# Patient Record
Sex: Male | Born: 1970 | Race: Black or African American | Hispanic: No | Smoking: Former smoker
Health system: Southern US, Community
[De-identification: ages and names within clinical notes are randomized; demographics above are authoritative.]

## PROBLEM LIST (undated history)

## (undated) DIAGNOSIS — M549 Dorsalgia, unspecified: Principal | ICD-10-CM

## (undated) DIAGNOSIS — K219 Gastro-esophageal reflux disease without esophagitis: Secondary | ICD-10-CM

## (undated) DIAGNOSIS — E119 Type 2 diabetes mellitus without complications: Secondary | ICD-10-CM

## (undated) DIAGNOSIS — M1990 Unspecified osteoarthritis, unspecified site: Secondary | ICD-10-CM

## (undated) DIAGNOSIS — IMO0002 Reserved for concepts with insufficient information to code with codable children: Secondary | ICD-10-CM

## (undated) DIAGNOSIS — G8929 Other chronic pain: Secondary | ICD-10-CM

## (undated) DIAGNOSIS — E7800 Pure hypercholesterolemia, unspecified: Secondary | ICD-10-CM

## (undated) DIAGNOSIS — I10 Essential (primary) hypertension: Secondary | ICD-10-CM

## (undated) HISTORY — PX: FOOT SURGERY: SHX648

---

## 2006-04-23 ENCOUNTER — Encounter: Admission: RE | Admit: 2006-04-23 | Discharge: 2006-04-23 | Payer: Self-pay | Admitting: Psychiatry

## 2006-12-19 ENCOUNTER — Encounter: Admission: RE | Admit: 2006-12-19 | Discharge: 2006-12-19 | Payer: Self-pay | Admitting: Neurosurgery

## 2008-03-22 ENCOUNTER — Encounter: Admission: RE | Admit: 2008-03-22 | Discharge: 2008-03-22 | Payer: Self-pay | Admitting: Orthopedic Surgery

## 2009-07-25 ENCOUNTER — Emergency Department (HOSPITAL_COMMUNITY): Admission: EM | Admit: 2009-07-25 | Discharge: 2009-07-25 | Payer: Self-pay | Admitting: Emergency Medicine

## 2009-08-11 ENCOUNTER — Emergency Department (HOSPITAL_COMMUNITY): Admission: EM | Admit: 2009-08-11 | Discharge: 2009-08-11 | Payer: Self-pay | Admitting: Emergency Medicine

## 2009-11-15 ENCOUNTER — Emergency Department (HOSPITAL_COMMUNITY): Admission: EM | Admit: 2009-11-15 | Discharge: 2009-11-15 | Payer: Self-pay | Admitting: Family Medicine

## 2009-11-24 ENCOUNTER — Ambulatory Visit: Payer: Self-pay | Admitting: Internal Medicine

## 2009-12-26 ENCOUNTER — Emergency Department (HOSPITAL_COMMUNITY): Admission: EM | Admit: 2009-12-26 | Discharge: 2009-12-26 | Payer: Self-pay | Admitting: Family Medicine

## 2009-12-26 ENCOUNTER — Ambulatory Visit (HOSPITAL_COMMUNITY): Admission: RE | Admit: 2009-12-26 | Discharge: 2009-12-26 | Payer: Self-pay | Admitting: Family Medicine

## 2010-02-01 ENCOUNTER — Emergency Department (HOSPITAL_COMMUNITY)
Admission: EM | Admit: 2010-02-01 | Discharge: 2010-02-01 | Payer: Self-pay | Source: Home / Self Care | Admitting: Family Medicine

## 2010-02-17 ENCOUNTER — Encounter (INDEPENDENT_AMBULATORY_CARE_PROVIDER_SITE_OTHER): Payer: Self-pay | Admitting: Family Medicine

## 2010-02-17 LAB — CONVERTED CEMR LAB
ALT: 18 units/L (ref 0–53)
Albumin: 4.8 g/dL (ref 3.5–5.2)
CO2: 26 meq/L (ref 19–32)
Cholesterol: 274 mg/dL — ABNORMAL HIGH (ref 0–200)
Eosinophils Absolute: 0.2 10*3/uL (ref 0.0–0.7)
Glucose, Bld: 77 mg/dL (ref 70–99)
LDL Cholesterol: 206 mg/dL — ABNORMAL HIGH (ref 0–99)
Lymphocytes Relative: 39 % (ref 12–46)
Lymphs Abs: 3.7 10*3/uL (ref 0.7–4.0)
Monocytes Relative: 7 % (ref 3–12)
Neutro Abs: 4.9 10*3/uL (ref 1.7–7.7)
Neutrophils Relative %: 51 % (ref 43–77)
Platelets: 228 10*3/uL (ref 150–400)
Potassium: 4.5 meq/L (ref 3.5–5.3)
RBC: 5.1 M/uL (ref 4.22–5.81)
Sed Rate: 1 mm/hr (ref 0–16)
Sodium: 142 meq/L (ref 135–145)
Total Bilirubin: 0.3 mg/dL (ref 0.3–1.2)
Total Protein: 7.4 g/dL (ref 6.0–8.3)
Triglycerides: 133 mg/dL (ref <150)
VLDL: 27 mg/dL (ref 0–40)
WBC: 9.6 10*3/uL (ref 4.0–10.5)

## 2010-04-09 ENCOUNTER — Encounter: Payer: Self-pay | Admitting: Orthopedic Surgery

## 2010-04-09 ENCOUNTER — Encounter: Payer: Self-pay | Admitting: Family Medicine

## 2010-05-12 ENCOUNTER — Other Ambulatory Visit (HOSPITAL_COMMUNITY): Payer: Self-pay | Admitting: Family Medicine

## 2010-05-12 DIAGNOSIS — M5136 Other intervertebral disc degeneration, lumbar region: Secondary | ICD-10-CM

## 2010-05-22 ENCOUNTER — Ambulatory Visit (HOSPITAL_COMMUNITY)
Admission: RE | Admit: 2010-05-22 | Discharge: 2010-05-22 | Disposition: A | Discharge status: Home / Self Care | Payer: Self-pay | Source: Ambulatory Visit | Attending: Family Medicine | Admitting: Family Medicine

## 2010-05-22 DIAGNOSIS — M545 Low back pain, unspecified: Secondary | ICD-10-CM | POA: Insufficient documentation

## 2010-05-22 DIAGNOSIS — R209 Unspecified disturbances of skin sensation: Secondary | ICD-10-CM | POA: Insufficient documentation

## 2010-05-22 DIAGNOSIS — M5136 Other intervertebral disc degeneration, lumbar region: Secondary | ICD-10-CM

## 2010-06-13 ENCOUNTER — Encounter (INDEPENDENT_AMBULATORY_CARE_PROVIDER_SITE_OTHER): Payer: Self-pay | Admitting: Family Medicine

## 2010-06-13 LAB — CONVERTED CEMR LAB
Cholesterol: 238 mg/dL — ABNORMAL HIGH (ref 0–200)
HDL: 40 mg/dL (ref >39)
Triglycerides: 104 mg/dL (ref <150)
VLDL: 21 mg/dL (ref 0–40)

## 2010-11-28 ENCOUNTER — Inpatient Hospital Stay (INDEPENDENT_AMBULATORY_CARE_PROVIDER_SITE_OTHER)
Admission: RE | Admit: 2010-11-28 | Discharge: 2010-11-28 | Disposition: A | Discharge status: Home / Self Care | Payer: Medicaid Other | Source: Ambulatory Visit | Attending: Family Medicine | Admitting: Family Medicine

## 2010-11-28 DIAGNOSIS — M545 Low back pain, unspecified: Secondary | ICD-10-CM

## 2010-12-25 ENCOUNTER — Emergency Department (HOSPITAL_COMMUNITY)
Admission: EM | Admit: 2010-12-25 | Discharge: 2010-12-25 | Disposition: A | Discharge status: Home / Self Care | Payer: Medicaid Other | Attending: Emergency Medicine | Admitting: Emergency Medicine

## 2010-12-25 DIAGNOSIS — M549 Dorsalgia, unspecified: Secondary | ICD-10-CM | POA: Insufficient documentation

## 2010-12-25 DIAGNOSIS — G8929 Other chronic pain: Secondary | ICD-10-CM | POA: Insufficient documentation

## 2010-12-25 DIAGNOSIS — R269 Unspecified abnormalities of gait and mobility: Secondary | ICD-10-CM | POA: Insufficient documentation

## 2011-01-11 ENCOUNTER — Inpatient Hospital Stay (INDEPENDENT_AMBULATORY_CARE_PROVIDER_SITE_OTHER)
Admission: RE | Admit: 2011-01-11 | Discharge: 2011-01-11 | Disposition: A | Discharge status: Home / Self Care | Payer: Medicaid Other | Source: Ambulatory Visit | Attending: Family Medicine | Admitting: Family Medicine

## 2011-01-11 DIAGNOSIS — M549 Dorsalgia, unspecified: Secondary | ICD-10-CM

## 2011-02-10 ENCOUNTER — Emergency Department (INDEPENDENT_AMBULATORY_CARE_PROVIDER_SITE_OTHER)
Admission: EM | Admit: 2011-02-10 | Discharge: 2011-02-10 | Disposition: A | Discharge status: Home / Self Care | Payer: Medicaid Other | Source: Home / Self Care | Attending: Family Medicine | Admitting: Family Medicine

## 2011-02-10 DIAGNOSIS — M549 Dorsalgia, unspecified: Secondary | ICD-10-CM

## 2011-02-10 DIAGNOSIS — G8929 Other chronic pain: Secondary | ICD-10-CM

## 2011-02-10 HISTORY — DX: Gastro-esophageal reflux disease without esophagitis: K21.9

## 2011-02-10 HISTORY — DX: Pure hypercholesterolemia, unspecified: E78.00

## 2011-02-10 HISTORY — DX: Reserved for concepts with insufficient information to code with codable children: IMO0002

## 2011-02-10 MED ORDER — GABAPENTIN 400 MG PO CAPS: 400.0000 mg | ORAL_CAPSULE | Freq: Three times a day (TID) | ORAL | Status: DC

## 2011-02-10 MED ORDER — TRAMADOL HCL 50 MG PO TABS: 50.0000 mg | ORAL_TABLET | Freq: Three times a day (TID) | ORAL | Status: AC | PRN

## 2011-02-10 NOTE — ED Provider Notes (Signed)
History     CSN: [id number] Arrival date & time: 02/10/2011 11:46 AM   First MD Initiated Contact with Patient 02/10/11 1049      Chief Complaint  Patient presents with  . Medication Refill    Pt needs refill on tramdol and nuerontin, ran out and office is closed.    (Consider location/radiation/quality/duration/timing/severity/associated sxs/prior treatment) HPI Comments: Pt requests refill of tramadol and gabapentin.  States ran out of tramadol this morning and gabapentin 11/22.  States his doctor's office was closed 11/22 until 11/26 for the holiday.  Denies new sx, states back pain is chronic due to arthritis and herniated discs. States is seeing neurosurgeon 12/11 for eval.   The history is provided by the patient.    Past Medical History  Diagnosis Date  . Hypercholesteremia   . Acid reflux   . Herniated disc     History reviewed. No pertinent past surgical history.  History reviewed. No pertinent family history.  History  Substance Use Topics  . Smoking status: Current Everyday Smoker -- 0.5 packs/day    Types: Cigarettes  . Smokeless tobacco: Not on file  . Alcohol Use: Yes      Review of Systems  Constitutional: Negative for fever and chills.  Genitourinary: Negative for difficulty urinating.       Denies urinary or fecal incontinence  Musculoskeletal: Positive for back pain.  Neurological: Negative for weakness and numbness.    Allergies  Other  Home Medications   Current Outpatient Rx  Name Route Sig Dispense Refill  . GABAPENTIN 400 MG PO CAPS Oral Take 400 mg by mouth 3 (three) times daily.      Marland Kitchen METHOCARBAMOL 500 MG PO TABS Oral Take 500 mg by mouth 4 (four) times daily.      . TRAMADOL HCL 50 MG PO TABS Oral Take 50 mg by mouth every 8 (eight) hours. Maximum dose= 8 tablets per day     . UNKNOWN TO PATIENT  Unknown acid reflux and chol med     . GABAPENTIN 400 MG PO CAPS Oral Take 1 capsule (400 mg total) by mouth 3 (three) times daily. 12  capsule 0  . TRAMADOL HCL 50 MG PO TABS Oral Take 1 tablet (50 mg total) by mouth every 8 (eight) hours as needed for pain. Maximum dose= 8 tablets per day 12 tablet 0    BP 123/78  Pulse 84  Temp(Src) 99 F (37.2 C) (Oral)  Resp 16  SpO2 98%  Physical Exam  Constitutional: He is oriented to person, place, and time. He appears well-developed and well-nourished.       Uncomfortable appearing  Pulmonary/Chest: Effort normal.  Musculoskeletal:       Lumbar spine is site of pain, nontender to palp  Neurological: He is alert and oriented to person, place, and time.       Gait normal    ED Course  Procedures (including critical care time)  Labs Reviewed - No data to display No results found.   1. Chronic back pain       MDM  Review of pt's previous visits to health system show 2 visits in October for same med refill request.  It appears pt is not following up with pcp.         Cathlyn Parsons, NP 02/10/11 1350

## 2011-02-10 NOTE — ED Provider Notes (Signed)
Medical screening examination/treatment/procedure(s) were performed by non-physician practitioner and as supervising physician I was immediately available for consultation/collaboration.  Corrie Mckusick, MD 02/10/11 1956

## 2011-11-13 DIAGNOSIS — M545 Low back pain, unspecified: Secondary | ICD-10-CM | POA: Insufficient documentation

## 2011-11-13 DIAGNOSIS — M7918 Myalgia, other site: Secondary | ICD-10-CM | POA: Insufficient documentation

## 2011-11-15 ENCOUNTER — Emergency Department (HOSPITAL_COMMUNITY)
Admission: EM | Admit: 2011-11-15 | Discharge: 2011-11-15 | Disposition: A | Discharge status: Home / Self Care | Payer: Medicaid Other | Source: Home / Self Care | Attending: Emergency Medicine | Admitting: Emergency Medicine

## 2011-11-15 ENCOUNTER — Encounter (HOSPITAL_COMMUNITY): Payer: Self-pay | Admitting: *Deleted

## 2011-11-15 DIAGNOSIS — M549 Dorsalgia, unspecified: Secondary | ICD-10-CM

## 2011-11-15 DIAGNOSIS — G8929 Other chronic pain: Secondary | ICD-10-CM

## 2011-11-15 HISTORY — DX: Other chronic pain: G89.29

## 2011-11-15 HISTORY — DX: Dorsalgia, unspecified: M54.9

## 2011-11-15 MED ORDER — KETOROLAC TROMETHAMINE 60 MG/2ML IM SOLN
INTRAMUSCULAR | Status: AC
  Filled 2011-11-15: qty 2

## 2011-11-15 MED ORDER — KETOROLAC TROMETHAMINE 30 MG/ML IJ SOLN
60.0000 mg | Freq: Once | INTRAMUSCULAR | Status: AC
  Administered 2011-11-15: 60 mg via INTRAMUSCULAR

## 2011-11-15 NOTE — ED Provider Notes (Signed)
History     CSN: [id number]  Arrival date & time 11/15/11  1633   First MD Initiated Contact with Patient 11/15/11 1657      Chief Complaint  Patient presents with  . Back Pain    (Consider location/radiation/quality/duration/timing/severity/associated sxs/prior treatment) HPI Comments: Patient with a long history of chronic back pain presents requesting a refill of his Norco. He normally takes etodolac, Norco, Neurontin, Robaxin, tramadol for his chronic back pain. This is managed by his primary care physician. He states that this generally worked well for him the past, but recently saw a specialist who discontinued his Robaxin and started him to Zanaflex (Dr. Deno Etienne at [facility]). Patient states that his back is "stiffer" with increased muscle spasms since starting the Zanaflex. He increased his normal Norco dose, and has run out. Otherwise, patient reports no change in his usual baseline pain  He was seen in the [facility] requesting refill of tramadol and Neurontin November 2012. He is given a shot of Toradol, which he says worked well. No visits to the ED or urgent care since.  Patient is a 41 y.o. male presenting with back pain. The history is provided by the patient. No language interpreter was used.  Back Pain  This is a chronic problem. The current episode started more than 1 week ago. The problem occurs constantly. The problem has not changed since onset.The pain is present in the lumbar spine. The quality of the pain is described as aching. The pain does not radiate. The symptoms are aggravated by bending, twisting and certain positions. The pain is worse during the day. Pertinent negatives include no fever, no numbness, no abdominal pain, no bowel incontinence, no bladder incontinence, no leg pain, no paresthesias and no weakness. He has tried NSAIDs, muscle relaxants, analgesics and bed rest for the symptoms. The treatment provided mild relief.    Past Medical History  Diagnosis Date    . Hypercholesteremia   . Acid reflux   . Herniated disc   . Chronic back pain     History reviewed. No pertinent past surgical history.  History reviewed. No pertinent family history.  History  Substance Use Topics  . Smoking status: Former Smoker -- 0.5 packs/day    Types: Cigarettes  . Smokeless tobacco: Not on file  . Alcohol Use: Yes      Review of Systems  Constitutional: Negative for fever.  Gastrointestinal: Negative for abdominal pain and bowel incontinence.  Genitourinary: Negative for bladder incontinence.  Musculoskeletal: Positive for back pain.  Neurological: Negative for weakness, numbness and paresthesias.    Allergies  Other  Home Medications   Current Outpatient Rx  Name Route Sig Dispense Refill  . ETODOLAC 200 MG PO CAPS Oral Take 200 mg by mouth every 8 (eight) hours.    Marland Kitchen HYDROCODONE-ACETAMINOPHEN 10-325 MG PO TABS Oral Take 1 tablet by mouth every 6 (six) hours as needed.    Marland Kitchen GABAPENTIN 400 MG PO CAPS Oral Take 400 mg by mouth 3 (three) times daily.      Marland Kitchen GABAPENTIN 400 MG PO CAPS Oral Take 1 capsule (400 mg total) by mouth 3 (three) times daily. 12 capsule 0  . METHOCARBAMOL 500 MG PO TABS Oral Take 500 mg by mouth 4 (four) times daily.      . TRAMADOL HCL 50 MG PO TABS Oral Take 50 mg by mouth every 8 (eight) hours. Maximum dose= 8 tablets per day     . UNKNOWN TO PATIENT  Unknown  acid reflux and chol med       BP 113/75  Pulse 84  Temp 98.7 F (37.1 C) (Oral)  Resp 19  SpO2 97%  Physical Exam  Nursing note and vitals reviewed. Constitutional: He is oriented to person, place, and time. He appears well-developed and well-nourished. No distress.  HENT:  Head: Normocephalic and atraumatic.  Eyes: Conjunctivae and EOM are normal.  Neck: Normal range of motion.  Cardiovascular: Normal rate.   Pulmonary/Chest: Effort normal. No respiratory distress.  Abdominal: He exhibits no distension.  Musculoskeletal: Normal range of motion.        Lumbar back: He exhibits tenderness and spasm. He exhibits no bony tenderness.        tenderness, spasm left paralumbar area. Bilateral lower extremities nontender. baseline ROM with intact PT pulses.   Sensation baseline light touch intact bilaterally for Pt, DTR's symmetric and intact bilaterally KJ , Motor symmetric bilateral 5/5 hip flexion, quadriceps, hamstrings, EHL, foot dorsiflexion, foot plantarflexion, gait somewhat antalgic but without apparent new ataxia.   Neurological: He is alert and oriented to person, place, and time. Coordination normal.  Skin: Skin is warm and dry.  Psychiatric: He has a normal mood and affect. His behavior is normal. Judgment and thought content normal.    ED Course  Procedures (including critical care time)  Labs Reviewed - No data to display No results found.   1. Chronic back pain     MDM  Previous records reviewed. As noted in history of present illness. [place] narcotic database reviewed.rx'd norco 10/325 #60 on 7/12, and #60 on 8/8 from Dr. Lerry Liner.   No evidence of any acute changes in his chronic pain today. No red flags. Suspect that's he was taken off a regimen that worked well for him in the past. Discussed with patient that it is his responsibility to maintain ongoing narcotic prescriptions with his primary care physician, but that I would be happy to give him a shot of Toradol here today in the clinic. Patient states that he'll be able to see  his PMD tomorrow for his narcotic prescriptions. Advised him to stop the Zanaflex, and restart the Robaxin. He says he has plenty of Robaxin. He will continue his Neurontin and tramadol., Which she says he has plenty of. Discussed  symptoms that should prompt  return to the department. Patient agrees with plan.  Luiz Blare, MD 11/15/11 2216

## 2011-11-15 NOTE — ED Notes (Signed)
Pt  Reports  He  Has  Chronic  Low  Back  Pain   He  Reports  He  Recently  Saw  Specialist    At  [facility]  hosp  Who  Changed  His  Muscle  Relaxant  He  Reports  Yesterday  He  Developed  Back  Spasms  He  States  He  Needs  meds   Until  Tuesday  When he  Can  See  His  Family  doctor

## 2011-11-16 ENCOUNTER — Telehealth (HOSPITAL_COMMUNITY): Payer: Self-pay | Admitting: *Deleted

## 2011-11-16 NOTE — ED Notes (Signed)
Dr. Mayford Knife office called and asked what medications did we give yesterday.  I told her we gave a shot of Toradol 60 mg. But no prescriptions.  Dr. Chaney Malling had reviewed his record and told him to stop the Zanaflex and take Robaxin, Neurontin and Tramadol which he already had.  They wanted to make sure we did not give narcotics and I said no we did not. Vassie Moselle 11/16/2011

## 2013-01-06 ENCOUNTER — Ambulatory Visit (INDEPENDENT_AMBULATORY_CARE_PROVIDER_SITE_OTHER): Payer: Medicaid Other | Admitting: Cardiovascular Disease

## 2013-01-06 ENCOUNTER — Encounter: Payer: Self-pay | Admitting: Cardiovascular Disease

## 2013-01-06 VITALS — BP 100/70 | HR 70 | Ht 71.0 in | Wt 168.3 lb

## 2013-01-06 DIAGNOSIS — E785 Hyperlipidemia, unspecified: Secondary | ICD-10-CM | POA: Insufficient documentation

## 2013-01-06 DIAGNOSIS — R7402 Elevation of levels of lactic acid dehydrogenase (LDH): Secondary | ICD-10-CM

## 2013-01-06 DIAGNOSIS — R7401 Elevation of levels of liver transaminase levels: Secondary | ICD-10-CM

## 2013-01-06 DIAGNOSIS — R7989 Other specified abnormal findings of blood chemistry: Secondary | ICD-10-CM | POA: Insufficient documentation

## 2013-01-06 DIAGNOSIS — R945 Abnormal results of liver function studies: Secondary | ICD-10-CM | POA: Insufficient documentation

## 2013-01-06 DIAGNOSIS — R002 Palpitations: Secondary | ICD-10-CM | POA: Insufficient documentation

## 2013-01-06 NOTE — Patient Instructions (Addendum)
Your physician has requested that you have an echocardiogram. Echocardiography is a painless test that uses sound waves to create images of your heart. It provides your doctor with information about the size and shape of your heart and how well your heart's chambers and valves are working. This procedure takes approximately one hour. There are no restrictions for this procedure.   Your physician has recommended that you wear an event monitor. Event monitors are medical devices that record the heart's electrical activity. Doctors most often [place] these monitors to diagnose arrhythmias. Arrhythmias are problems with the speed or rhythm of the heartbeat. The monitor is a small, portable device. You can wear one while you do your normal daily activities. This is usually used to diagnose what is causing palpitations/syncope (passing out).  Your physician recommends that you schedule a follow-up appointment in: 4-6 weeks.  Your physician recommends that you return for lab work to check your liver functions.

## 2013-01-06 NOTE — Progress Notes (Signed)
Patient ID: Wayne Roberts, male   DOB: 05-05-1970, 42 y.o.   MRN: [id number]     PATIENT PROFILE: [name] is a 42 year old African American male who is referred through the courtesy of Dr. Lerry Liner for evaluation of irregular heartbeat and question of possible heart block.   HPI: [name] is a 42 year old gentleman who admits to a long-standing history of episodic palpitations. Recently, he has started to notice the sensation of longer pauses. These typically are short lived but they seen to have prolonged compared to recently. He's uncertain if he feels a premature beat prior to the pause. Apparently he saw Dr. Raechel Chute and is now referred for cardiology evaluation  The patient is on disability for back degenerative disc disease. He does have a history of GERD. He apparently has a history of hyperlipidemia. He takes tramadol as well as hydrocodone and Robaxin for his back discomfort. He has been on lovastatin for his hyperlipidemia. He denies any flank syncope or presyncope. He denies any chest tightness. He denies significant sleep disturbance. He presents for evaluation.  Past Medical History  Diagnosis Date  . Hypercholesteremia   . Acid reflux   . Herniated disc   . Chronic back pain     History reviewed. No pertinent past surgical history.  Allergies  Allergen Reactions  . Other     Pt has nausea with diflu? Arthritis med?    Current Outpatient Prescriptions  Medication Sig Dispense Refill  . HYDROcodone-acetaminophen (NORCO) 10-325 MG per tablet Take 1 tablet by mouth every 6 (six) hours as needed.      . lovastatin (MEVACOR) 40 MG tablet Take 40 mg by mouth at bedtime.      . methocarbamol (ROBAXIN) 500 MG tablet Take 500 mg by mouth 4 (four) times daily.        Marland Kitchen omeprazole (PRILOSEC) 20 MG capsule Take 20 mg by mouth daily.      . traMADol (ULTRAM) 50 MG tablet Take 50 mg by mouth every 8 (eight) hours. Maximum dose= 8 tablets per day        No current  facility-administered medications for this visit.    Social history is notable in that he is separated. He has one child a son age 82 in kindergarten. Typically he takes his child to school and he goes back home and rests and sleeps and then ultimately picks the his son up from school later in the afternoon. There is a remote tobacco history but he quit in August 2014. He states he only drinks rare alcohol. He does not routinely exercise and when he does it steadily for 15 minutes at a time  Family History  Problem Relation Age of Onset  . Cancer - Other Father     liver  . Cancer - Other Maternal Grandmother   . Cancer - Other Maternal Grandfather     ROS is negative for fever chills or night sweats. He denies skin rash. He does have multiple tattoos. He denies visual symptoms. He denies presyncope or syncope. He does note palpitations. He denies chest pressure. He denies wheezing. He denies shortness of breath. He denies sputum production. He denies cough. He denies abdominal pain. Does have a history of GERD. He denies change in bowel or bladder habits. He denies blood in his urine or stool. He denies any knowledge of liver disease. He does have chronic low back pain. He states in the past he was told of having a bulging disc  and this may have improved. He denies claudication symptoms. He denies myalgias. He denies peripheral neuropathy. He is unaware of any diabetes. Other system review is negative.  PE BP 100/70  Pulse 70  Ht 5\' 11"  (1.803 m)  Wt 168 lb 4.8 oz (76.34 kg)  BMI 23.48 kg/m2 Repeat blood pressure was 112/70 General: Alert, oriented, no distress.  Skin: normal turgor, no rashes; multiple tattoos HEENT: Normocephalic, atraumatic. Pupils round and reactive; sclera anicteric; Fundi no hemorrhages or exudates Nose without nasal septal hypertrophy Mouth/Parynx benign; Mallinpatti scale 3 Neck: No JVD, no carotid briuts Lungs: clear to ausculatation and percussion; no wheezing  or rales No chest wall tenderness or bony tenderness. Heart: RRR, s1 s2 normal no S3 or S4. 1/6 systolic murmur. Abdomen: soft, nontender; no hepatosplenomehaly, BS+; abdominal aorta nontender and not dilated by palpation. Specifically, no right upper quadrant tenderness. Pulses 2+ Extremities: no clubbinbg cyanosis or edema, Homan's sign negative  Neurologic: grossly nonfocal    ECG: Normal sinus rhythm at 70 beats per minute. QTc interval 393 ms  LABS:  BMET    Component Value Date/Time   NA 142 02/17/2010 2013   K 4.5 02/17/2010 2013   CL 105 02/17/2010 2013   CO2 26 02/17/2010 2013   GLUCOSE 77 02/17/2010 2013   BUN 12 02/17/2010 2013   CREATININE 1.02 02/17/2010 2013   CALCIUM 9.8 02/17/2010 2013     Hepatic Function Panel     Component Value Date/Time   PROT 7.4 02/17/2010 2013   ALBUMIN 4.8 02/17/2010 2013   AST 19 02/17/2010 2013   ALT 50 06/13/2010 2033   ALKPHOS 91 02/17/2010 2013   BILITOT 0.3 02/17/2010 2013     CBC    Component Value Date/Time   WBC 9.6 02/17/2010 2013   RBC 5.10 02/17/2010 2013   HGB 15.6 02/17/2010 2013   HCT 45.3 02/17/2010 2013   PLT 228 02/17/2010 2013   MCV 88.8 02/17/2010 2013   MCHC 34.4 02/17/2010 2013   RDW 14.1 02/17/2010 2013   LYMPHSABS 3.7 02/17/2010 2013   MONOABS 0.7 02/17/2010 2013   EOSABS 0.2 02/17/2010 2013   BASOSABS 0.1 02/17/2010 2013     BNP No results found for this basename: probnp    Lipid Panel     Component Value Date/Time   CHOL 238* 06/13/2010 2033   TRIG 104 06/13/2010 2033   HDL 40 06/13/2010 2033   CHOLHDL 6.0 Ratio 06/13/2010 2033   VLDL 21 06/13/2010 2033   LDLCALC 177* 06/13/2010 2033     RADIOLOGY: No results found.   ASSESSMENT AND PLAN: My impression is that [name] is a 42 year old African American gentleman who has been experiencing episodic sensation of pauses. I suspect he may be having PVCs with compensatory pause versus APCs. However, I am recommending that he wear a 2 week event monitor for  further evaluation of his cardiac arrhythmia to make certain he is not having any heart block. I am scheduling him for a 2-D echo Doppler study. Immediately after the patient left the office, I did receive his laboratory which was done by Dr. Mayford Knife. This was notable for marked elevation of his liver function studies with an SGOT of 139 and SGPT of 113. In addition, his serum cholesterol was 265, triglycerides 207, HDL cholesterol 45, increased VLDL cholesterol at 41, and significant increase in LDL cholesterol 179. We have contacted him and have recommended he discontinue his lovastatin cause of his elevated LFTs. We will obtain followup  BMET, LFTs, and magnesium lab work in several weeks. Am also scheduling him for a 2-D echo Doppler study to evaluate LV systolic, diastolic and valvular function. If his LFTs remain elevated, I would recommend abdominal ultrasound. I will see him back in approximately 3-4 weeks followup evaluation or sooner problems arise.   Lennette Bihari, MD, [facility] 01/06/2013 6:48 PM

## 2013-01-07 ENCOUNTER — Encounter: Payer: Self-pay | Admitting: Cardiovascular Disease

## 2013-01-20 ENCOUNTER — Other Ambulatory Visit (HOSPITAL_COMMUNITY): Payer: Self-pay | Admitting: Cardiovascular Disease

## 2013-01-20 ENCOUNTER — Ambulatory Visit (HOSPITAL_COMMUNITY)
Admission: RE | Admit: 2013-01-20 | Discharge: 2013-01-20 | Disposition: A | Discharge status: Home / Self Care | Payer: Medicaid Other | Source: Ambulatory Visit | Attending: Internal Medicine | Admitting: Internal Medicine

## 2013-01-20 DIAGNOSIS — R002 Palpitations: Secondary | ICD-10-CM

## 2013-01-20 LAB — HEPATIC FUNCTION PANEL
ALT: 18 U/L (ref 0–53)
AST: 20 U/L (ref 0–37)
Bilirubin, Direct: <0.1 mg/dL (ref 0.0–0.3)
Total Protein: 6.9 g/dL (ref 6.0–8.3)

## 2013-01-20 LAB — COMPREHENSIVE METABOLIC PANEL
AST: 20 U/L (ref 0–37)
Alkaline Phosphatase: 81 U/L (ref 39–117)
BUN: 13 mg/dL (ref 6–23)
Creat: 1.1 mg/dL (ref 0.50–1.35)
Glucose, Bld: 134 mg/dL — ABNORMAL HIGH (ref 70–99)
Total Bilirubin: 0.3 mg/dL (ref 0.3–1.2)

## 2013-01-20 NOTE — Progress Notes (Signed)
2D Echo Performed 01/20/2013    Pietro Bonura, RCS  

## 2013-01-26 ENCOUNTER — Encounter: Payer: Self-pay | Admitting: *Deleted

## 2013-02-10 ENCOUNTER — Encounter: Payer: Self-pay | Admitting: Cardiovascular Disease

## 2013-02-10 ENCOUNTER — Ambulatory Visit (INDEPENDENT_AMBULATORY_CARE_PROVIDER_SITE_OTHER): Payer: Medicaid Other | Admitting: Cardiovascular Disease

## 2013-02-10 VITALS — BP 90/60 | HR 76 | Ht 71.0 in | Wt 168.0 lb

## 2013-02-10 DIAGNOSIS — R945 Abnormal results of liver function studies: Secondary | ICD-10-CM

## 2013-02-10 DIAGNOSIS — R7989 Other specified abnormal findings of blood chemistry: Secondary | ICD-10-CM

## 2013-02-10 DIAGNOSIS — R002 Palpitations: Secondary | ICD-10-CM

## 2013-02-10 DIAGNOSIS — E785 Hyperlipidemia, unspecified: Secondary | ICD-10-CM

## 2013-02-10 NOTE — Progress Notes (Signed)
Patient ID: Wayne Roberts, male   DOB: December 26, 1970, 42 y.o.   MRN: [id number]     HPI:  [name] is a 42 year old African American male who presents for followup evaluation. I had seen him one month ago through the courtesy of Dr. Lerry Roberts for evaluation of irregular heartbeat and question of possible heart block.  [name] is a 42 year old gentleman who admits to a long-standing history of episodic palpitations. Recently, he had started to notice the sensation of for palpitations with occasional pauses.  He's uncertain if he feels a premature beat prior to the pause.   The patient is on disability for back degenerative disc disease. He does have a history of GERD. He apparently has a history of hyperlipidemia. He takes tramadol as well as hydrocodone and Robaxin for his back discomfort. He has been on lovastatin for his hyperlipidemia. He denies any flank syncope or presyncope. He denies any chest tightness. He denies significant sleep disturbance. He presents for evaluation.  When I saw him, I did obtain the laboratory results from Dr. Mayford Roberts which demonstrated significant elevation of liver function studies with an SGOT of 139 SGPT of 113. His total cholesterol was 265 triglycerides 207 HDL 45 and LDL 179. With his LFT elevation I recommended he discontinue his lovastatin. He did obtain followup laboratory off lovastatin several weeks later and he had complete normalization of his liver function studies with AST of 20 and ALT of 16.  [name] did wear an event monitor for 2 weeks. No arrhythmia was detected with the exception of mild sinus tach episodes of sinus tachycardia. There was no ventricular ectopy or significant pauses. A 2-D echo Doppler study was done to confirm normal systolic function with an ejection fraction of 60-65%. Normal diastolic parameters. He did not have any significant valvular pathology.  [name] states that he has an overtly started to begin to take the  lovastatin after he completed his Holter monitor.  Past Medical History  Diagnosis Date  . Hypercholesteremia   . Acid reflux   . Herniated disc   . Chronic back pain     History reviewed. No pertinent past surgical history.  Allergies  Allergen Reactions  . Other     Pt has nausea with diflu? Arthritis med?    Current Outpatient Prescriptions  Medication Sig Dispense Refill  . HYDROcodone-acetaminophen (NORCO) 10-325 MG per tablet Take 1 tablet by mouth every 6 (six) hours as needed.      . lovastatin (MEVACOR) 40 MG tablet Take 40 mg by mouth at bedtime.      . methocarbamol (ROBAXIN) 500 MG tablet Take 500 mg by mouth 4 (four) times daily.        Marland Kitchen omeprazole (PRILOSEC) 20 MG capsule Take 20 mg by mouth daily.      . traMADol (ULTRAM) 50 MG tablet Take 50 mg by mouth every 8 (eight) hours. Maximum dose= 8 tablets per day        No current facility-administered medications for this visit.    Social history is notable in that he is separated. He has one child a son age 58 in kindergarten. Typically he takes his child to school and he goes back home and rests and sleeps and then ultimately picks the his son up from school later in the afternoon. There is a remote tobacco history but he quit in August 2014. He states he only drinks rare alcohol. He does not routinely exercise and when he does  it steadily for 15 minutes at a time  Family History  Problem Relation Age of Onset  . Cancer - Other Father     liver  . Cancer - Other Maternal Grandmother   . Cancer - Other Maternal Grandfather     ROS is negative for fever chills or night sweats. He denies skin rash. He does have multiple tattoos. He denies visual symptoms. He denies presyncope or syncope. His palpitations have improved. He denies chest pressure. He denies wheezing. He denies shortness of breath. He denies sputum production. He denies cough. He denies abdominal pain. There is a history of GERD. He denies change in bowel  or bladder habits. He denies blood in his urine or stool. He denies any knowledge of liver disease. He does have chronic low back pain. He states in the past he was told of having a bulging disc and this may have improved. He denies claudication symptoms. He denies myalgias. He denies peripheral neuropathy. He is unaware of any diabetes. Other comprehensive 12 point system review is negative.  PE BP 90/60  Pulse 76  Ht 5\' 11"  (1.803 m)  Wt 168 lb (76.204 kg)  BMI 23.44 kg/m2 Repeat blood pressure by me was 110/70 Repeat blood pressure was 112/70 General: Alert, oriented, no distress.  Skin: normal turgor, no rashes; multiple tattoos HEENT: Normocephalic, atraumatic. Pupils round and reactive; sclera anicteric; Fundi no hemorrhages or exudates Nose without nasal septal hypertrophy Mouth/Parynx benign; Mallinpatti scale 3 Neck: No JVD, no carotid briuts Lungs: clear to ausculatation and percussion; no wheezing or rales No chest wall tenderness or bony tenderness. Heart: RRR, s1 s2 normal no S3 or S4. 1/6 systolic murmur. Abdomen: soft, nontender; no hepatosplenomehaly, BS+; abdominal aorta nontender and not dilated by palpation. Specifically, no right upper quadrant tenderness. Pulses 2+ Extremities: no clubbinbg cyanosis or edema, Homan's sign negative  Neurologic: grossly nonfocal    ECG: Normal sinus rhythm at 76 beats per minute. QTc interval 405 ms  LABS:  BMET    Component Value Date/Time   NA 139 01/20/2013 0813   K 3.9 01/20/2013 0813   CL 106 01/20/2013 0813   CO2 23 01/20/2013 0813   GLUCOSE 134* 01/20/2013 0813   BUN 13 01/20/2013 0813   CREATININE 1.10 01/20/2013 0813   CREATININE 1.02 02/17/2010 2013   CALCIUM 9.3 01/20/2013 0813     Hepatic Function Panel     Component Value Date/Time   PROT 6.9 01/20/2013 0816   ALBUMIN 4.5 01/20/2013 0816   AST 20 01/20/2013 0816   ALT 18 01/20/2013 0816   ALKPHOS 77 01/20/2013 0816   BILITOT 0.3 01/20/2013 0816   BILIDIR <0.1  01/20/2013 0816   IBILI NOT CALC 01/20/2013 0816     CBC    Component Value Date/Time   WBC 9.6 02/17/2010 2013   RBC 5.10 02/17/2010 2013   HGB 15.6 02/17/2010 2013   HCT 45.3 02/17/2010 2013   PLT 228 02/17/2010 2013   MCV 88.8 02/17/2010 2013   MCHC 34.4 02/17/2010 2013   RDW 14.1 02/17/2010 2013   LYMPHSABS 3.7 02/17/2010 2013   MONOABS 0.7 02/17/2010 2013   EOSABS 0.2 02/17/2010 2013   BASOSABS 0.1 02/17/2010 2013     BNP No results found for this basename: probnp    Lipid Panel     Component Value Date/Time   CHOL 238* 06/13/2010 2033   TRIG 104 06/13/2010 2033   HDL 40 06/13/2010 2033   CHOLHDL 6.0 Ratio 06/13/2010 2033  VLDL 21 06/13/2010 2033   LDLCALC 177* 06/13/2010 2033     RADIOLOGY: No results found.   ASSESSMENT AND PLAN: My impression is that [name] is a 42 year old African American gentleman who had been experiencing episodic sensation of pauses. His recent two-week monitor did not detect any arrhythmia. Seemingly, his palpitations have improved. I suspect some of these episodes may have been PACs or perhaps PVCs. Of note, his LFTs entirely normalized with discontinuance of lovastatin. I have recommended that he stay off this drug indefinitely. He tells me he will follow up with Dr. Mayford Roberts in 2 weeks. We discussed dietary adjustment and attempt to reduce his cholesterol. If his LFTs remain norma off statin therapy it may be worthwhile to consider a trial of Zetia for his hyperlipidemia.  Lennette Bihari, MD 02/10/2013 12:56 PM

## 2013-02-10 NOTE — Patient Instructions (Signed)
Your physician recommends that you schedule a follow-up appointment as needed.  Your physician has recommended you make the following change in your medication: stay off of the cholesterol medication and follow up with your PCP. Dr. Mayford Knife for further treatment for your cholesterol.

## 2015-01-09 ENCOUNTER — Emergency Department (INDEPENDENT_AMBULATORY_CARE_PROVIDER_SITE_OTHER): Payer: Medicaid Other

## 2015-01-09 ENCOUNTER — Emergency Department (HOSPITAL_COMMUNITY)
Admission: EM | Admit: 2015-01-09 | Discharge: 2015-01-09 | Disposition: A | Discharge status: Home / Self Care | Payer: Medicaid Other | Source: Home / Self Care | Attending: Emergency Medicine | Admitting: Emergency Medicine

## 2015-01-09 ENCOUNTER — Emergency Department (HOSPITAL_COMMUNITY): Payer: Medicaid Other

## 2015-01-09 ENCOUNTER — Encounter (HOSPITAL_COMMUNITY): Payer: Self-pay | Admitting: Emergency Medicine

## 2015-01-09 DIAGNOSIS — R05 Cough: Secondary | ICD-10-CM

## 2015-01-09 DIAGNOSIS — R058 Other specified cough: Secondary | ICD-10-CM

## 2015-01-09 MED ORDER — PREDNISONE 50 MG PO TABS: ORAL_TABLET | ORAL | Status: DC

## 2015-01-09 NOTE — ED Provider Notes (Signed)
CSN: [id number]     Arrival date & time 01/09/15  1312 History   First MD Initiated Contact with Patient 01/09/15 1353     Chief Complaint  Patient presents with  . Cough   (Consider location/radiation/quality/duration/timing/severity/associated sxs/prior Treatment) HPI  He is a 44 year old man here for evaluation of cough. He states his symptoms started 10-14 days ago with cough, nasal congestion, rhinorrhea, sore throat. He states all of the symptoms have resolved except for cough. He continues to have coughing spells so bad he has posttussive emesis. He also describes fatigue. He reports intermittent shortness of breath. His wife told him she heard wheezing, but he denies any wheezing. No fevers or chills. He has tried multiple over-the-counter medications without improvement. He did take one of his hydrocodone pills for his back today, and states it does seem to have helped with the cough.  No history of asthma or allergies.  Past Medical History  Diagnosis Date  . Hypercholesteremia   . Acid reflux   . Herniated disc   . Chronic back pain    History reviewed. No pertinent past surgical history. Family History  Problem Relation Age of Onset  . Cancer - Other Father     liver  . Cancer - Other Maternal Grandmother   . Cancer - Other Maternal Grandfather    Social History  Substance Use Topics  . Smoking status: Former Smoker -- 0.50 packs/day    Types: Cigarettes  . Smokeless tobacco: Never Used  . Alcohol Use: 0.5 oz/week    1 drink(s) per week    Review of Systems As in history of present illness Allergies  Other  Home Medications   Prior to Admission medications   Medication Sig Start Date End Date Taking? Authorizing Provider  HYDROcodone-acetaminophen (NORCO) 10-325 MG per tablet Take 1 tablet by mouth every 6 (six) hours as needed.   Yes Historical Provider, MD  lovastatin (MEVACOR) 40 MG tablet Take 40 mg by mouth at bedtime.   Yes Historical Provider, MD   methocarbamol (ROBAXIN) 500 MG tablet Take 500 mg by mouth 4 (four) times daily.     Yes Historical Provider, MD  omeprazole (PRILOSEC) 20 MG capsule Take 20 mg by mouth daily.   Yes Historical Provider, MD  traMADol (ULTRAM) 50 MG tablet Take 50 mg by mouth every 8 (eight) hours. Maximum dose= 8 tablets per day    Yes Historical Provider, MD  predniSONE (DELTASONE) 50 MG tablet Take 1 pill daily for 5 days. 01/09/15   Charm Rings, MD   Meds Ordered and Administered this Visit  Medications - No data to display  BP 144/95 mmHg  Pulse 90  Temp(Src) 99.6 F (37.6 C)  Resp 28  SpO2 100% No data found.   Physical Exam  Constitutional: He is oriented to person, place, and time. He appears well-developed and well-nourished. No distress.  HENT:  Mouth/Throat: Oropharynx is clear and moist. No oropharyngeal exudate.  Neck: Neck supple.  Cardiovascular: Normal rate, regular rhythm and normal heart sounds.   No murmur heard. Pulmonary/Chest: Effort normal and breath sounds normal. No respiratory distress. He has no wheezes. He has no rales.  Neurological: He is alert and oriented to person, place, and time.    ED Course  Procedures (including critical care time)  Labs Review Labs Reviewed - No data to display  Imaging Review Dg Chest 2 View  01/09/2015  CLINICAL DATA:  Cough x2 weeks EXAM: CHEST  2 VIEW COMPARISON:  None. FINDINGS: Lungs are clear.  No pleural effusion or pneumothorax. The heart is normal in size. Visualized osseous structures are within normal limits. IMPRESSION: Normal chest radiographs. Electronically Signed   By: Charline BillsSriyesh  Krishnan M.D.   On: 01/09/2015 14:15       MDM   1. Post-viral cough syndrome    X-ray normal. Treat with prednisone for 5 days. Discussed that his home hydrocodone and tramadol will also help suppress the cough. Return precautions reviewed.    Charm RingsErin J Honig, MD 01/09/15 [phone]

## 2015-01-09 NOTE — Discharge Instructions (Signed)
You have something called postviral cough syndrome.  This is when you have a lot of inflammation after a viral infection. Your chest x-ray is negative for pneumonia. Take prednisone daily for the next 5 days. This should improve the cough significantly. Your home medicines for your back pain, the hydrocodone and tramadol, will also help suppress the cough. You may continue to have some cough for another 2-4 weeks. If you develop fevers, nausea, body aches, please come back.

## 2015-01-09 NOTE — ED Notes (Signed)
C/o persistent prod cough onset 2 weeks; just got over a cold Taking OTC meds w/no relief; also taking Tramadol and Hydrocodone  Denies fevers, chills A&O x4... No acute distress.

## 2016-02-11 ENCOUNTER — Encounter (HOSPITAL_COMMUNITY): Payer: Self-pay | Admitting: *Deleted

## 2016-02-11 ENCOUNTER — Ambulatory Visit (HOSPITAL_COMMUNITY)
Admission: EM | Admit: 2016-02-11 | Discharge: 2016-02-11 | Disposition: A | Discharge status: Home / Self Care | Payer: Medicaid Other | Attending: Emergency Medicine | Admitting: Emergency Medicine

## 2016-02-11 DIAGNOSIS — M545 Low back pain: Secondary | ICD-10-CM

## 2016-02-11 DIAGNOSIS — M62830 Muscle spasm of back: Secondary | ICD-10-CM | POA: Diagnosis not present

## 2016-02-11 DIAGNOSIS — G8929 Other chronic pain: Secondary | ICD-10-CM

## 2016-02-11 MED ORDER — METHYLPREDNISOLONE ACETATE 80 MG/ML IJ SUSP
INTRAMUSCULAR | Status: AC
  Filled 2016-02-11: qty 1

## 2016-02-11 MED ORDER — METHYLPREDNISOLONE ACETATE 80 MG/ML IJ SUSP
80.0000 mg | Freq: Once | INTRAMUSCULAR | Status: AC
  Administered 2016-02-11: 80 mg via INTRAMUSCULAR

## 2016-02-11 MED ORDER — HYDROCODONE-ACETAMINOPHEN 5-325 MG PO TABS: 1.0000 | ORAL_TABLET | Freq: Four times a day (QID) | ORAL | 0 refills | Status: DC | PRN

## 2016-02-11 MED ORDER — CYCLOBENZAPRINE HCL 10 MG PO TABS: 10.0000 mg | ORAL_TABLET | Freq: Three times a day (TID) | ORAL | 0 refills | Status: DC | PRN
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
## 2016-02-11 NOTE — ED Provider Notes (Signed)
[facility]    CSN: [id number] Arrival date & time: 02/11/16  1218     History   Chief Complaint Chief Complaint  Patient presents with  . Back Pain    HPI Wayne Roberts is a 45 y.o. male.   HPI He is a 45 year old man here for back pain. He reports a history of chronic low back pain. He states with his old doctor, he was treated with meloxicam and hydrocodone-10mg .  He states this worked pretty well for him. His doctor closed in August. His new doctor has started him on meloxicam and Robaxin. He states he is out of the Robaxin at this time. His chronic pain is stable. He reports a new spastic type pain in the right lower back. He states this started after sitting for an hour and a half in the car. No radiating pain. No numbness, tingling, weakness in the legs. No bowel or bladder incontinence.  Past Medical History:  Diagnosis Date  . Acid reflux   . Chronic back pain   . Herniated disc   . Hypercholesteremia     Patient Active Problem List   Diagnosis Date Noted  . Palpitations 01/06/2013  . Hyperlipidemia 01/06/2013  . Abnormal liver function tests 01/06/2013    History reviewed. No pertinent surgical history.     Home Medications    Prior to Admission medications   Medication Sig Start Date End Date Taking? Authorizing Provider  cyclobenzaprine (FLEXERIL) 10 MG tablet Take 1 tablet (10 mg total) by mouth 3 (three) times daily as needed for muscle spasms. 02/11/16   Charm RingsErin J Clarion Mooneyhan, MD  HYDROcodone-acetaminophen (NORCO) 5-325 MG tablet Take 1 tablet by mouth every 6 (six) hours as needed for moderate pain. 02/11/16   Charm RingsErin J Marla Pouliot, MD  lovastatin (MEVACOR) 40 MG tablet Take 40 mg by mouth at bedtime.    Historical Provider, MD  methocarbamol (ROBAXIN) 500 MG tablet Take 500 mg by mouth 4 (four) times daily.      Historical Provider, MD  omeprazole (PRILOSEC) 20 MG capsule Take 20 mg by mouth daily.    Historical Provider, MD    Family History Family  History  Problem Relation Age of Onset  . Cancer - Other Father     liver  . Cancer - Other Maternal Grandfather   . Cancer - Other Maternal Grandmother     Social History Social History  Substance Use Topics  . Smoking status: Former Smoker    Packs/day: 0.50    Types: Cigarettes  . Smokeless tobacco: Never Used  . Alcohol use 0.5 oz/week    1 Standard drinks or equivalent per week     Allergies   Other   Review of Systems Review of Systems As in history of present illness  Physical Exam Triage Vital Signs ED Triage Vitals [02/11/16 1238]  Enc Vitals Group     BP 133/90     Pulse Rate 86     Resp 18     Temp 98.6 F (37 C)     Temp Source Oral     SpO2 98 %     Weight      Height      Head Circumference      Peak Flow      Pain Score      Pain Loc      Pain Edu?      Excl. in GC?    No data found.   Updated Vital Signs BP  133/90 (BP Location: Left Arm)   Pulse 86   Temp 98.6 F (37 C) (Oral)   Resp 18   SpO2 98%   Visual Acuity Right Eye Distance:   Left Eye Distance:   Bilateral Distance:    Right Eye Near:   Left Eye Near:    Bilateral Near:     Physical Exam  Constitutional: He is oriented to person, place, and time. He appears well-developed and well-nourished. No distress.  Moves stiffly  Cardiovascular: Normal rate.   Pulmonary/Chest: Effort normal.  Musculoskeletal:       Back:  Back: No erythema or edema. No vertebral tenderness or step-offs. He does have a thermic care patch on his lumbar back. No point tenderness. Negative straight leg raise. 5 out of 5 strength.  Neurological: He is alert and oriented to person, place, and time.     UC Treatments / Results  Labs (all labs ordered are listed, but only abnormal results are displayed) Labs Reviewed - No data to display  EKG  EKG Interpretation None       Radiology No results found.  Procedures Procedures (including critical care time)  Medications Ordered in  UC Medications  methylPREDNISolone acetate (DEPO-MEDROL) injection 80 mg (80 mg Intramuscular Given 02/11/16 1343)     Initial Impression / Assessment and Plan / UC Course  I have reviewed the triage vital signs and the nursing notes.  Pertinent labs & imaging results that were available during my care of the patient were reviewed by me and considered in my medical decision making (see chart for details).  Clinical Course     We'll treat with a steroid injection as he states this has been beneficial in the past. Prescription given for Flexeril to help with what sounds like muscle spasms. 20 hydrocodone tablets given to get him to his pain appointment on the 28th.  [place] of [place] shows he last received hydrocodone-10 July 28.  Final Clinical Impressions(s) / UC Diagnoses   Final diagnoses:  Chronic bilateral low back pain without sciatica  Muscle spasm of back    New Prescriptions New Prescriptions   CYCLOBENZAPRINE (FLEXERIL) 10 MG TABLET    Take 1 tablet (10 mg total) by mouth 3 (three) times daily as needed for muscle spasms.   HYDROCODONE-ACETAMINOPHEN (NORCO) 5-325 MG TABLET    Take 1 tablet by mouth every 6 (six) hours as needed for moderate pain.     Charm RingsErin J Dedria Endres, MD 02/11/16 1346

## 2016-02-11 NOTE — Discharge Instructions (Signed)
We gave you a steroid shot today to help with your pain. Use the Flexeril 3 times a day to help with the muscle spasms. Use the hydrocodone every 4-6 hours as needed for severe pain. Follow-up with the pain specialist as scheduled on the 28th.

## 2016-12-08 ENCOUNTER — Emergency Department (HOSPITAL_COMMUNITY)
Admission: EM | Admit: 2016-12-08 | Discharge: 2016-12-08 | Disposition: A | Discharge status: Home / Self Care | Payer: Medicaid Other | Attending: Emergency Medicine | Admitting: Emergency Medicine

## 2016-12-08 ENCOUNTER — Encounter (HOSPITAL_COMMUNITY): Payer: Self-pay

## 2016-12-08 ENCOUNTER — Emergency Department (HOSPITAL_COMMUNITY): Payer: Medicaid Other

## 2016-12-08 DIAGNOSIS — M549 Dorsalgia, unspecified: Secondary | ICD-10-CM | POA: Insufficient documentation

## 2016-12-08 DIAGNOSIS — Z79899 Other long term (current) drug therapy: Secondary | ICD-10-CM | POA: Insufficient documentation

## 2016-12-08 DIAGNOSIS — Y999 Unspecified external cause status: Secondary | ICD-10-CM | POA: Insufficient documentation

## 2016-12-08 DIAGNOSIS — Y9389 Activity, other specified: Secondary | ICD-10-CM | POA: Diagnosis not present

## 2016-12-08 DIAGNOSIS — Y92410 Unspecified street and highway as the place of occurrence of the external cause: Secondary | ICD-10-CM | POA: Insufficient documentation

## 2016-12-08 DIAGNOSIS — Z87891 Personal history of nicotine dependence: Secondary | ICD-10-CM | POA: Diagnosis not present

## 2016-12-08 MED ORDER — BACLOFEN 10 MG PO TABS: 10.0000 mg | ORAL_TABLET | Freq: Three times a day (TID) | ORAL | 0 refills | Status: DC

## 2016-12-08 MED ORDER — MELOXICAM 15 MG PO TABS: 15.0000 mg | ORAL_TABLET | Freq: Every day | ORAL | 0 refills | Status: DC
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
## 2016-12-08 NOTE — Discharge Instructions (Signed)
Your imaging is negative for any significant abnormality. Return to the emergency department immediately if you develop any of the following symptoms: You have numbness, tingling, or weakness in the arms or legs. You develop severe headaches not relieved with medicine. You have severe neck pain, especially tenderness in the middle of the back of your neck. You have changes in bowel or bladder control. There is increasing pain in any area of the body. You have shortness of breath, light-headedness, dizziness, or fainting. You have chest pain. You feel sick to your stomach (nauseous), throw up (vomit), or sweat. You have increasing abdominal discomfort. There is blood in your urine, stool, or vomit. You have pain in your shoulder (shoulder strap areas). You feel your symptoms are getting worse.

## 2016-12-08 NOTE — ED Notes (Signed)
Patient transported to X-ray 

## 2017-10-11 ENCOUNTER — Encounter (HOSPITAL_COMMUNITY): Payer: Self-pay | Admitting: Emergency Medicine

## 2017-10-11 ENCOUNTER — Ambulatory Visit (HOSPITAL_COMMUNITY)
Admission: EM | Admit: 2017-10-11 | Discharge: 2017-10-11 | Disposition: A | Discharge status: Home / Self Care | Payer: Medicaid Other | Attending: Family Medicine | Admitting: Family Medicine

## 2017-10-11 ENCOUNTER — Other Ambulatory Visit: Payer: Self-pay

## 2017-10-11 DIAGNOSIS — M5442 Lumbago with sciatica, left side: Secondary | ICD-10-CM | POA: Diagnosis not present

## 2017-10-11 DIAGNOSIS — M5441 Lumbago with sciatica, right side: Secondary | ICD-10-CM

## 2017-10-11 DIAGNOSIS — G8929 Other chronic pain: Secondary | ICD-10-CM | POA: Diagnosis not present

## 2017-10-11 MED ORDER — KETOROLAC TROMETHAMINE 60 MG/2ML IM SOLN
INTRAMUSCULAR | Status: AC
  Filled 2017-10-11: qty 2

## 2017-10-11 MED ORDER — METHYLPREDNISOLONE ACETATE 80 MG/ML IJ SUSP
80.0000 mg | Freq: Once | INTRAMUSCULAR | Status: AC
  Administered 2017-10-11: 80 mg via INTRAMUSCULAR

## 2017-10-11 MED ORDER — METHYLPREDNISOLONE ACETATE 80 MG/ML IJ SUSP
INTRAMUSCULAR | Status: AC
  Filled 2017-10-11: qty 1

## 2017-10-11 MED ORDER — KETOROLAC TROMETHAMINE 60 MG/2ML IM SOLN: 60.0000 mg | Freq: Once | INTRAMUSCULAR | Status: DC

## 2017-10-11 NOTE — ED Provider Notes (Signed)
[facility]    CSN: [id number] Arrival date & time: 10/11/17  0808     History   Chief Complaint Chief Complaint  Patient presents with  . Back Pain    HPI Wayne Roberts is a 47 y.o. male.   Patient is a 48 year old male with past medical history of acid reflux, chronic back pain, herniated disc, hypercholesterolemia.  He presents with chronic back pain that flared up and worsened about 1 week ago.  He denies any reinjury to the back.  He has been taking his naproxen and muscle relaxers as prescribed with minimal relief.  He is having numbness and tingling in his bilateral upper extremities which is consistent with his chronic back pain.  He denies any new symptoms associated with the back pain.  He denies any fever, chills, body aches, urinary symptoms.  He denies any loss of bowel or bladder control.  He had a steroid injection in his back about 1 month ago.  He says these usually last about 2 weeks.   ROS per HPI      Past Medical History:  Diagnosis Date  . Acid reflux   . Chronic back pain   . Herniated disc   . Hypercholesteremia     Patient Active Problem List   Diagnosis Date Noted  . Palpitations 01/06/2013  . Hyperlipidemia 01/06/2013  . Abnormal liver function tests 01/06/2013    Past Surgical History:  Procedure Laterality Date  . FOOT SURGERY         Home Medications    Prior to Admission medications   Medication Sig Start Date End Date Taking? Authorizing Provider  methocarbamol (ROBAXIN) 500 MG tablet Take 500 mg by mouth 4 (four) times daily.     Yes [provider]  omeprazole (PRILOSEC) 20 MG capsule Take 20 mg by mouth daily.   Yes [provider]  rosuvastatin (CRESTOR) 10 MG tablet Take 10 mg by mouth daily.   Yes [provider]  baclofen (LIORESAL) 10 MG tablet Take 1 tablet (10 mg total) by mouth 3 (three) times daily. 12/08/16   Arthor Captain, PA-C  cyclobenzaprine (FLEXERIL) 10 MG tablet Take 1  tablet (10 mg total) by mouth 3 (three) times daily as needed for muscle spasms. 02/11/16   Charm Rings, MD  HYDROcodone-acetaminophen (NORCO) 5-325 MG tablet Take 1 tablet by mouth every 6 (six) hours as needed for moderate pain. 02/11/16   Charm Rings, MD  lovastatin (MEVACOR) 40 MG tablet Take 40 mg by mouth at bedtime.    [provider]  meloxicam (MOBIC) 15 MG tablet Take 1 tablet (15 mg total) by mouth daily. Take 1 daily with food. 12/08/16   Arthor Captain, PA-C    Family History Family History  Problem Relation Age of Onset  . Cancer - Other Father        liver  . Cancer - Other Maternal Grandfather   . Cancer - Other Maternal Grandmother     Social History Social History   Tobacco Use  . Smoking status: Former Smoker    Packs/day: 0.25    Types: Cigarettes  . Smokeless tobacco: Never Used  Substance Use Topics  . Alcohol use: Yes    Alcohol/week: 0.6 oz    Types: 1 Standard drinks or equivalent per week  . Drug use: No     Allergies   Other   Review of Systems Review of Systems   Physical Exam Triage Vital Signs ED Triage  Vitals  Enc Vitals Group     BP 10/11/17 0826 131/87     Pulse Rate 10/11/17 0826 86     Resp 10/11/17 0826 16     Temp 10/11/17 0826 97.9 F (36.6 C)     Temp Source 10/11/17 0826 Oral     SpO2 10/11/17 0826 98 %     Weight 10/11/17 0829 185 lb (83.9 kg)     Height 10/11/17 0829 5\' 11"  (1.803 m)     Head Circumference --      Peak Flow --      Pain Score 10/11/17 0829 8     Pain Loc --      Pain Edu? --      Excl. in GC? --    No data found.  Updated Vital Signs BP 131/87 (BP Location: Left Arm)   Pulse 86   Temp 97.9 F (36.6 C) (Oral)   Resp 16   Ht 5\' 11"  (1.803 m)   Wt 185 lb (83.9 kg)   SpO2 98%   BMI 25.80 kg/m   Visual Acuity Right Eye Distance:   Left Eye Distance:   Bilateral Distance:    Right Eye Near:   Left Eye Near:    Bilateral Near:     Physical Exam  Constitutional: He is  oriented to person, place, and time. He appears well-developed and well-nourished.  Neck: Normal range of motion.  Pulmonary/Chest: Effort normal.  Musculoskeletal: He exhibits tenderness. He exhibits no edema.  Tender to palpation across the entire lumbar spine.   Neurological: He is alert and oriented to person, place, and time.  Skin: Skin is warm and dry.  Psychiatric: He has a normal mood and affect.  Nursing note and vitals reviewed.    UC Treatments / Results  Labs (all labs ordered are listed, but only abnormal results are displayed) Labs Reviewed - No data to display  EKG None  Radiology No results found.  Procedures Procedures (including critical care time)  Medications Ordered in UC Medications  methylPREDNISolone acetate (DEPO-MEDROL) injection 80 mg (80 mg Intramuscular Given 10/11/17 0916)    Initial Impression / Assessment and Plan / UC Course  I have reviewed the triage vital signs and the nursing notes.  Pertinent labs & imaging results that were available during my care of the patient were reviewed by me and considered in my medical decision making (see chart for details).     Patient presents with chronic lower back pain.  He has been taking his medications as prescribed for this pain with minimal relief.  There are no new injuries to the back or no new associated symptoms.  We will give a steroid injection to hopefully help with the pain.  Continue medications as prescribed.  Follow-up with orthopedics if not getting better in the next week.  Return precautions given Final Clinical Impressions(s) / UC Diagnoses   Final diagnoses:  Chronic midline low back pain with bilateral sciatica     Discharge Instructions     It was nice meeting you!!  We gave you a steroid shot for your back pain.  Hopefully this will help.  Continue to take your other medications as needed for your back pain. Follow up with your orthopedic as needed.     ED  Prescriptions    None     Controlled Substance Prescriptions [place] Controlled Substance Registry consulted? Not Applicable   Janace ArisBast, Hong Moring A, NP 10/11/17 [phone]

## 2017-10-11 NOTE — Discharge Instructions (Addendum)
It was nice meeting you!!  We gave you a steroid shot for your back pain.  Hopefully this will help.  Continue to take your other medications as needed for your back pain. Follow up with your orthopedic as needed.

## 2017-10-11 NOTE — ED Triage Notes (Signed)
Patient states he has chronic lower back pain and usually gets a shot about once a year.  States his regular doctor can't see him and he is in a lot of pain.

## 2018-01-10 ENCOUNTER — Encounter: Payer: Self-pay | Admitting: Podiatry

## 2018-01-10 ENCOUNTER — Other Ambulatory Visit: Payer: Self-pay

## 2018-01-10 ENCOUNTER — Ambulatory Visit (INDEPENDENT_AMBULATORY_CARE_PROVIDER_SITE_OTHER): Payer: Medicaid Other

## 2018-01-10 ENCOUNTER — Ambulatory Visit: Payer: Medicaid Other | Admitting: Podiatry

## 2018-01-10 VITALS — BP 133/91 | HR 96 | Resp 16 | Ht 71.0 in | Wt 185.0 lb

## 2018-01-10 DIAGNOSIS — K219 Gastro-esophageal reflux disease without esophagitis: Secondary | ICD-10-CM | POA: Insufficient documentation

## 2018-01-10 DIAGNOSIS — M25571 Pain in right ankle and joints of right foot: Secondary | ICD-10-CM

## 2018-01-10 DIAGNOSIS — M7751 Other enthesopathy of right foot: Secondary | ICD-10-CM

## 2018-01-10 DIAGNOSIS — Q828 Other specified congenital malformations of skin: Secondary | ICD-10-CM

## 2018-01-10 DIAGNOSIS — M21961 Unspecified acquired deformity of right lower leg: Secondary | ICD-10-CM

## 2018-01-10 NOTE — Progress Notes (Signed)
   Subjective:    Patient ID: Wayne Roberts, male    DOB: 12-13-1970, 47 y.o.   MRN: [id number]  HPI    Review of Systems  Musculoskeletal: Positive for arthralgias and myalgias.  All other systems reviewed and are negative.      Objective:   Physical Exam        Assessment & Plan:

## 2018-02-04 ENCOUNTER — Other Ambulatory Visit: Payer: Self-pay

## 2018-02-21 ENCOUNTER — Ambulatory Visit: Payer: Medicaid Other | Admitting: Podiatry

## 2018-02-21 DIAGNOSIS — M7751 Other enthesopathy of right foot: Secondary | ICD-10-CM

## 2018-02-21 DIAGNOSIS — Q828 Other specified congenital malformations of skin: Secondary | ICD-10-CM | POA: Diagnosis not present

## 2018-03-17 NOTE — Progress Notes (Signed)
  Subjective:  Patient ID: Wayne Roberts, Wayne Roberts    DOB: 03/25/1970,  MRN: [id number]  Chief Complaint  Patient presents with  . Foot Pain    follow up capsulitis right foot    47 y.o. Wayne Roberts presents with the above complaint.  Having painful lesion attempted to care for himself Review of Systems: Negative except as noted in the HPI. Denies N/V/F/Ch.  Past Medical History:  Diagnosis Date  . Acid reflux   . Chronic back pain   . Herniated disc   . Hypercholesteremia     Current Outpatient Medications:  .  [employer] D3 2000 units CAPS, Take 2,000 Units by mouth daily., Disp: , Rfl: 11 .  JANUMET XR [phone] MG TB24, TAKE 1 TABLET BY MOUTH EVERY DAY FOR DIABETES, Disp: , Rfl: 3 .  methocarbamol (ROBAXIN) 750 MG tablet, TAKE 1 TABLET BY MOUTH EVERY 4 HOURS, Disp: , Rfl: 2 .  naproxen (NAPROSYN) 500 MG tablet, Take 500 mg by mouth 2 (two) times daily as needed. for pain, Disp: , Rfl: 1 .  omeprazole (PRILOSEC) 20 MG capsule, Take 20 mg by mouth daily., Disp: , Rfl:  .  rosuvastatin (CRESTOR) 10 MG tablet, Take 10 mg by mouth daily., Disp: , Rfl:   Social History   Tobacco Use  Smoking Status Former Smoker  . Packs/day: 0.25  . Types: Cigarettes  Smokeless Tobacco Never Used    Allergies  Allergen Reactions  . Diclofenac Nausea And Vomiting  . Other     Pt has nausea with diflu? Arthritis med?   Objective:   There were no vitals filed for this visit. There is no height or weight on file to calculate BMI. Constitutional Well developed. Well nourished.  Vascular Dorsalis pedis pulses palpable bilaterally. Posterior tibial pulses palpable bilaterally. Capillary refill normal to all digits.  No cyanosis or clubbing noted. Pedal hair growth normal.  Neurologic Normal speech. Oriented to person, place, and time. Epicritic sensation to light touch grossly present bilaterally.  Dermatologic Nails well groomed and normal in appearance. No open wounds. Punctate hyperkeratosis right  second metatarsal  Orthopedic: Normal joint ROM without pain or crepitus bilaterally. No visible deformities. Palpation about the right second metatarsal   Radiographs: None today Assessment:   1. Capsulitis of metatarsophalangeal (MTP) joint of right foot   2. Porokeratosis    Plan:  Patient was evaluated and treated and all questions answered.  Capsulitis R 2nd MPJ -Lesion courtesy pared for patient advised any stick to care lesion himself. -Would consider possible surgical dimension in the future if pain persists  Return in about 2 months (around 04/24/2018) for Capsulitis, Porokeratosis.

## 2018-03-17 NOTE — Progress Notes (Signed)
  Subjective:  Patient ID: Wayne Roberts, male    DOB: 23-May-1970,  MRN: [id number]  Chief Complaint  Patient presents with  . Foot Pain    R sub met 2 pain with a lesion x 4 years; 8/10 shooting pain when walking -no injuries Tx: trimming Pt. states," I've had 3 previous surgeries for the same conditin and the Dr took bones out of my 2nd-4th toea dn he snappd something out of my back heel."     47 y.o. male presents with the above complaint. History as above.  Review of Systems: Negative except as noted in the HPI. Denies N/V/F/Ch.  Past Medical History:  Diagnosis Date  . Acid reflux   . Chronic back pain   . Herniated disc   . Hypercholesteremia     Current Outpatient Medications:  .  CVS D3 2000 units CAPS, Take 2,000 Units by mouth daily., Disp: , Rfl: 11 .  methocarbamol (ROBAXIN) 750 MG tablet, TAKE 1 TABLET BY MOUTH EVERY 4 HOURS, Disp: , Rfl: 2 .  naproxen (NAPROSYN) 500 MG tablet, Take 500 mg by mouth 2 (two) times daily as needed. for pain, Disp: , Rfl: 1 .  omeprazole (PRILOSEC) 20 MG capsule, Take 20 mg by mouth daily., Disp: , Rfl:  .  rosuvastatin (CRESTOR) 10 MG tablet, Take 10 mg by mouth daily., Disp: , Rfl:  .  JANUMET XR [phone] MG TB24, TAKE 1 TABLET BY MOUTH EVERY DAY FOR DIABETES, Disp: , Rfl: 3  Social History   Tobacco Use  Smoking Status Former Smoker  . Packs/day: 0.25  . Types: Cigarettes  Smokeless Tobacco Never Used    Allergies  Allergen Reactions  . Diclofenac Nausea And Vomiting  . Other     Pt has nausea with diflu? Arthritis med?   Objective:   Vitals:   01/10/18 0828  BP: (!) 133/91  Pulse: 96  Resp: 16   Body mass index is 25.8 kg/m. Constitutional Well developed. Well nourished.  Vascular Dorsalis pedis pulses palpable bilaterally. Posterior tibial pulses palpable bilaterally. Capillary refill normal to all digits.  No cyanosis or clubbing noted. Pedal hair growth normal.  Neurologic Normal speech. Oriented to person,  place, and time. Epicritic sensation to light touch grossly present bilaterally.  Dermatologic Nails well groomed and normal in appearance. No open wounds. Punctate hyperkeratosis right second metatarsal  Orthopedic: Normal joint ROM without pain or crepitus bilaterally. No visible deformities. Palpation about the right second metatarsal   Radiographs: Taken and reviewed evidence of bunionectomy, elongated second third metatarsal/short first metatarsal. Assessment:   1. Capsulitis of metatarsophalangeal (MTP) joint of right foot   2. Pain in joint involving ankle and foot, right   3. Metatarsal deformity, right   4. Porokeratosis    Plan:  Patient was evaluated and treated and all questions answered.  Capsulitis R 2nd MPJ -XR Reviewed as above -Discussed with patient at this time I think further surgical intervention is not warranted as he has had multiple surgeries without relief.  Discussed patient performing self care of the lesion and to wear good shoes and inserts to prevent further pain and discomfort  Return in about 6 weeks (around 02/21/2018) for Capslutus .

## 2018-04-24 ENCOUNTER — Ambulatory Visit: Payer: Medicaid Other | Admitting: Podiatry

## 2019-02-23 ENCOUNTER — Other Ambulatory Visit: Payer: Self-pay

## 2019-02-23 DIAGNOSIS — Z20822 Contact with and (suspected) exposure to covid-19: Secondary | ICD-10-CM

## 2019-02-25 LAB — NOVEL CORONAVIRUS, NAA: SARS-CoV-2, NAA: NOT DETECTED

## 2020-05-26 ENCOUNTER — Ambulatory Visit (INDEPENDENT_AMBULATORY_CARE_PROVIDER_SITE_OTHER): Payer: Medicaid Other

## 2020-05-26 ENCOUNTER — Ambulatory Visit (HOSPITAL_COMMUNITY)
Admission: EM | Admit: 2020-05-26 | Discharge: 2020-05-26 | Disposition: A | Discharge status: Home / Self Care | Payer: Medicaid Other | Attending: Urgent Care | Admitting: Urgent Care

## 2020-05-26 ENCOUNTER — Encounter (HOSPITAL_COMMUNITY): Payer: Self-pay

## 2020-05-26 ENCOUNTER — Other Ambulatory Visit: Payer: Self-pay

## 2020-05-26 DIAGNOSIS — M79672 Pain in left foot: Secondary | ICD-10-CM | POA: Diagnosis not present

## 2020-05-26 DIAGNOSIS — M79675 Pain in left toe(s): Secondary | ICD-10-CM

## 2020-05-26 MED ORDER — PREDNISONE 10 MG PO TABS: 30.0000 mg | ORAL_TABLET | Freq: Every day | ORAL | 0 refills | Status: DC

## 2020-05-26 NOTE — ED Provider Notes (Signed)
[facility]   MRN: [id number] DOB: 1970-05-25  Subjective:   Wayne Roberts is a 50 y.o. male presenting for 2-week history of persistent left great toe pain.  Symptoms started after he was racing with his son, was on the street and lost his balance.  He ended up falling very awkwardly and landing on his wrist and his arm and torso turning his foot as well.  Reports that initially he had left-sided pain, shoulder pain, collarbone pain but this is since improved.  He has been using an over-the-counter NSAID.  However his foot pain continues over his great toe, is a throbbing type sensation that occurs intermittently but daily.  It is not constant.  Denies history of gout, arthritis, diabetes.  No current facility-administered medications for this encounter.  Current Outpatient Medications:  .  [employer] D3 2000 units CAPS, Take 2,000 Units by mouth daily., Disp: , Rfl: 11 .  JANUMET XR [phone] MG TB24, TAKE 1 TABLET BY MOUTH EVERY DAY FOR DIABETES, Disp: , Rfl: 3 .  methocarbamol (ROBAXIN) 750 MG tablet, TAKE 1 TABLET BY MOUTH EVERY 4 HOURS, Disp: , Rfl: 2 .  naproxen (NAPROSYN) 500 MG tablet, Take 500 mg by mouth 2 (two) times daily as needed. for pain, Disp: , Rfl: 1 .  omeprazole (PRILOSEC) 20 MG capsule, Take 20 mg by mouth daily., Disp: , Rfl:  .  rosuvastatin (CRESTOR) 10 MG tablet, Take 10 mg by mouth daily., Disp: , Rfl:    Allergies  Allergen Reactions  . Diclofenac Nausea And Vomiting  . Other     Pt has nausea with diflu? Arthritis med?    Past Medical History:  Diagnosis Date  . Acid reflux   . Chronic back pain   . Herniated disc   . Hypercholesteremia      Past Surgical History:  Procedure Laterality Date  . FOOT SURGERY      Family History  Problem Relation Age of Onset  . Cancer - Other Father        liver  . Cancer - Other Maternal Grandfather   . Cancer - Other Maternal Grandmother     Social History   Tobacco Use  . Smoking status:  Former Smoker    Packs/day: 0.25    Types: Cigarettes  . Smokeless tobacco: Never Used  Vaping Use  . Vaping Use: Never used  Substance Use Topics  . Alcohol use: Yes    Alcohol/week: 1.0 standard drink    Types: 1 Standard drinks or equivalent per week  . Drug use: No    ROS   Objective:   Vitals: BP (!) 134/93 (BP Location: Right Arm)   Pulse 88   Temp 99.5 F (37.5 C) (Oral)   Resp 16   SpO2 100%   Physical Exam Constitutional:      General: He is not in acute distress.    Appearance: Normal appearance. He is well-developed and normal weight. He is not ill-appearing, toxic-appearing or diaphoretic.  HENT:     Head: Normocephalic and atraumatic.     Right Ear: External ear normal.     Left Ear: External ear normal.     Nose: Nose normal.     Mouth/Throat:     Pharynx: Oropharynx is clear.  Eyes:     General: No scleral icterus.       Right eye: No discharge.        Left eye: No discharge.     Extraocular  Movements: Extraocular movements intact.     Pupils: Pupils are equal, round, and reactive to light.  Cardiovascular:     Rate and Rhythm: Normal rate.  Pulmonary:     Effort: Pulmonary effort is normal.  Musculoskeletal:     Cervical back: Normal range of motion.       Feet:  Neurological:     Mental Status: He is alert and oriented to person, place, and time.     Cranial Nerves: No cranial nerve deficit.     Motor: No weakness.     Coordination: Coordination normal.     Gait: Gait normal.     Deep Tendon Reflexes: Reflexes normal.  Psychiatric:        Mood and Affect: Mood normal.        Behavior: Behavior normal.        Thought Content: Thought content normal.        Judgment: Judgment normal.    DG Foot Complete Left  Result Date: 05/26/2020 CLINICAL DATA:  Pain.  Great toe pain. EXAM: LEFT FOOT - COMPLETE 3+ VIEW COMPARISON:  None. FINDINGS: There is no evidence of fracture or dislocation. There is no evidence of arthropathy or other focal  bone abnormality. Soft tissues are unremarkable. IMPRESSION: Negative. Electronically Signed   By: Katherine Mantle M.D.   On: 05/26/2020 18:44   Assessment and Plan :   PDMP not reviewed this encounter.  1. Great toe pain, left     X-rays reassuring, no signs of infection process.  Patient has not responded to NSAID therapy.  Will use 30 mg prednisone course.  Recommended rest off of this foot. Counseled patient on potential for adverse effects with medications prescribed/recommended today, ER and return-to-clinic precautions discussed, patient verbalized understanding.    Wallis Bamberg, [place] 05/26/20 [id number]

## 2020-05-26 NOTE — ED Triage Notes (Signed)
Pt c/o left foot and collar bone pain. He states he was running and fell 2 weeks ago. Pt states the pain has worsened.

## 2020-10-20 ENCOUNTER — Ambulatory Visit (HOSPITAL_COMMUNITY)
Admission: EM | Admit: 2020-10-20 | Discharge: 2020-10-20 | Disposition: A | Discharge status: Home / Self Care | Payer: Medicaid Other | Attending: Physician Assistant | Admitting: Physician Assistant

## 2020-10-20 ENCOUNTER — Encounter (HOSPITAL_COMMUNITY): Payer: Self-pay

## 2020-10-20 ENCOUNTER — Other Ambulatory Visit: Payer: Self-pay

## 2020-10-20 DIAGNOSIS — L03011 Cellulitis of right finger: Secondary | ICD-10-CM

## 2020-10-20 NOTE — ED Provider Notes (Signed)
[facility]    CSN: [id number] Arrival date & time: 10/20/20  1605      History   Chief Complaint Chief Complaint  Patient presents with   Finger Infection    HPI Wayne Roberts is a 50 y.o. male.   Pt complains of thumb pain and swelling around the base of the nail bed that started about two weeks ago. Denies injury or trauma.  Reports he had a hang nail there originally.  He has tried nothing for the pain.    Elevated BP here today.  Pt denies chest pain, shortness of breath, headache, lower extremity edema, palpitations.  He does not have a PCP.  Will give resources to find a PCP.    Past Medical History:  Diagnosis Date   Acid reflux    Chronic back pain    Herniated disc    Hypercholesteremia     Patient Active Problem List   Diagnosis Date Noted   GERD (gastroesophageal reflux disease) 01/10/2018   Palpitations 01/06/2013   Hyperlipidemia 01/06/2013   Abnormal liver function tests 01/06/2013   Low back pain 11/13/2011   Myofascial muscle pain 11/13/2011    Past Surgical History:  Procedure Laterality Date   FOOT SURGERY         Home Medications    Prior to Admission medications   Medication Sig Start Date End Date Taking? Authorizing Provider  CVS D3 2000 units CAPS Take 2,000 Units by mouth daily. 11/28/17   [provider]  JANUMET XR [phone] MG TB24 TAKE 1 TABLET BY MOUTH EVERY DAY FOR DIABETES 01/25/18   [provider]  methocarbamol (ROBAXIN) 750 MG tablet TAKE 1 TABLET BY MOUTH EVERY 4 HOURS 11/20/17   [provider]  naproxen (NAPROSYN) 500 MG tablet Take 500 mg by mouth 2 (two) times daily as needed. for pain 12/23/17   [provider]  omeprazole (PRILOSEC) 20 MG capsule Take 20 mg by mouth daily.    [provider]  predniSONE (DELTASONE) 10 MG tablet Take 3 tablets (30 mg total) by mouth daily with breakfast. 05/26/20   Wallis Bamberg, PA-C  rosuvastatin (CRESTOR) 10 MG tablet Take 10 mg by  mouth daily.    [provider]    Family History Family History  Problem Relation Age of Onset   Cancer - Other Father        liver   Cancer - Other Maternal Grandfather    Cancer - Other Maternal Grandmother     Social History Social History   Tobacco Use   Smoking status: Former    Packs/day: 0.25    Types: Cigarettes   Smokeless tobacco: Never  Vaping Use   Vaping Use: Never used  Substance Use Topics   Alcohol use: Yes    Alcohol/week: 1.0 standard drink    Types: 1 Standard drinks or equivalent per week   Drug use: No     Allergies   Diclofenac and Other   Review of Systems Review of Systems  Constitutional:  Negative for chills and fever.  HENT:  Negative for ear pain and sore throat.   Eyes:  Negative for pain and visual disturbance.  Respiratory:  Negative for cough and shortness of breath.   Cardiovascular:  Negative for chest pain and palpitations.  Gastrointestinal:  Negative for abdominal pain and vomiting.  Genitourinary:  Negative for dysuria and hematuria.  Musculoskeletal:  Positive for arthralgias (right thumb swelling pain). Negative for back pain.  Skin:  Negative for color change and rash.  Neurological:  Negative for seizures and syncope.  All other systems reviewed and are negative.   Physical Exam Triage Vital Signs ED Triage Vitals  Enc Vitals Group     BP 10/20/20 1646 (!) 157/102     Pulse Rate 10/20/20 1646 75     Resp 10/20/20 1646 18     Temp 10/20/20 1646 98.9 F (37.2 C)     Temp Source 10/20/20 1646 Oral     SpO2 10/20/20 1646 95 %     Weight --      Height --      Head Circumference --      Peak Flow --      Pain Score 10/20/20 1647 8     Pain Loc --      Pain Edu? --      Excl. in GC? --    No data found.  Updated Vital Signs BP (!) 157/102 (BP Location: Right Arm)   Pulse 75   Temp 98.9 F (37.2 C) (Oral)   Resp 18   SpO2 95%   Visual Acuity Right Eye Distance:   Left Eye Distance:    Bilateral Distance:    Right Eye Near:   Left Eye Near:    Bilateral Near:     Physical Exam Vitals and nursing note reviewed.  Constitutional:      Appearance: He is well-developed.  HENT:     Head: Normocephalic and atraumatic.  Eyes:     Conjunctiva/sclera: Conjunctivae normal.  Cardiovascular:     Rate and Rhythm: Normal rate and regular rhythm.     Heart sounds: No murmur heard. Pulmonary:     Effort: Pulmonary effort is normal. No respiratory distress.     Breath sounds: Normal breath sounds.  Abdominal:     Palpations: Abdomen is soft.     Tenderness: There is no abdominal tenderness.  Musculoskeletal:     Cervical back: Neck supple.     Comments: Right thumb with swelling, redness, and fluctuance along the lateral aspect of nail bed consistent with paronychia.   Skin:    General: Skin is warm and dry.  Neurological:     Mental Status: He is alert.     UC Treatments / Results  Labs (all labs ordered are listed, but only abnormal results are displayed) Labs Reviewed - No data to display  EKG   Radiology No results found.  Procedures Incision and Drainage  Date/Time: 10/20/2020 5:05 PM Performed by: Jodell Cipro, PA-C Authorized by: Jodell Cipro, PA-C   Consent:    Consent obtained:  Verbal   Consent given by:  Patient   Risks, benefits, and alternatives were discussed: yes     Risks discussed:  Incomplete drainage, bleeding and pain   Alternatives discussed:  No treatment Universal protocol:    Procedure explained and questions answered to patient or proxy's satisfaction: yes     Patient identity confirmed:  Verbally with patient Location:    Indications for incision and drainage: paronychia.   Location:  Upper extremity   Upper extremity location:  Hand   Hand location:  R hand (right thumb, lateral nail bed) Pre-procedure details:    Skin preparation:  Chlorhexidine Sedation:    Sedation type:  None Anesthesia:    Anesthesia  method:  None Procedure type:    Complexity:  Simple Post-procedure details:    Procedure completion:  Tolerated Comments:     Copious amount of pus  drained with pain relief after drainage (including critical care time)  Medications Ordered in UC Medications - No data to display  Initial Impression / Assessment and Plan / UC Course  I have reviewed the triage vital signs and the nursing notes.  Pertinent labs & imaging results that were available during my care of the patient were reviewed by me and considered in my medical decision making (see chart for details).     Right thumb paronychia, drained in clinic with relief of pain.  Advised to continue with warm soaks. Return precautions discussed.   Advised to follow up with Primary Care physician regarding blood pressure.  Final Clinical Impressions(s) / UC Diagnoses   Final diagnoses:  None   Discharge Instructions   None    ED Prescriptions   None    PDMP not reviewed this encounter.   Jodell Cipro, PA-C 10/20/20 1715

## 2020-10-20 NOTE — ED Triage Notes (Signed)
Pt presents with infection in left thumb finger.

## 2020-10-20 NOTE — Discharge Instructions (Addendum)
Keep area clean and dry Continue with warm soaks Return for evaluation if swelling and pain returns.

## 2020-12-02 ENCOUNTER — Ambulatory Visit (HOSPITAL_COMMUNITY)
Admission: EM | Admit: 2020-12-02 | Discharge: 2020-12-02 | Disposition: A | Discharge status: Home / Self Care | Payer: Medicaid Other | Attending: Emergency Medicine | Admitting: Emergency Medicine

## 2020-12-02 ENCOUNTER — Encounter (HOSPITAL_COMMUNITY): Payer: Self-pay | Admitting: Emergency Medicine

## 2020-12-02 ENCOUNTER — Other Ambulatory Visit: Payer: Self-pay

## 2020-12-02 DIAGNOSIS — M75101 Unspecified rotator cuff tear or rupture of right shoulder, not specified as traumatic: Secondary | ICD-10-CM | POA: Diagnosis not present

## 2020-12-02 MED ORDER — METHYLPREDNISOLONE 4 MG PO TBPK: ORAL_TABLET | Freq: Every day | ORAL | 0 refills | Status: DC

## 2020-12-02 NOTE — Discharge Instructions (Addendum)
try sleeping with a pillow between his arm and your body to help decompress the shoulder at night. increase your Tylenol to 1000 mg 3-4 times a day, continue meloxicam and Zanaflex.  If this is not helpful, then go ahead and start the Medrol Dosepak.

## 2020-12-02 NOTE — ED Provider Notes (Signed)
HPI  SUBJECTIVE:  Wayne Roberts is a 50 y.o. male who presents with 2 issues: first he reports 6 months of right shoulder pain due to a torn rotator cuff.  He has surgery scheduled for this on 10/6.  He states that it is intermittent, seconds long, and getting worse.  It is located along the top of the shoulder.  No reinjury, limitation of motion.  He has been taking 1 tab of Tylenol arthritis, meloxicam and Zanaflex daily with improvement in his symptoms. symptoms worse with reaching, lying down at night.  He is requesting a steroid injection f in his shoulder.   Second, he would like his left thumb checked.  He had a I&D of a paronychia fairly recently, reports yellowish discolored ration of the nail.  No pain, erythema, swelling.  He has a history of arthritis.  No history of diabetes.  PMD: Renaye Rakers, MD   Past Medical History:  Diagnosis Date   Acid reflux    Chronic back pain    Herniated disc    Hypercholesteremia     Past Surgical History:  Procedure Laterality Date   FOOT SURGERY      Family History  Problem Relation Age of Onset   Cancer - Other Father        liver   Cancer - Other Maternal Grandfather    Cancer - Other Maternal Grandmother     Social History   Tobacco Use   Smoking status: Former    Packs/day: 0.25    Types: Cigarettes   Smokeless tobacco: Never  Vaping Use   Vaping Use: Never used  Substance Use Topics   Alcohol use: Yes    Alcohol/week: 1.0 standard drink    Types: 1 Standard drinks or equivalent per week   Drug use: No    No current facility-administered medications for this encounter.  Current Outpatient Medications:    methylPREDNISolone (MEDROL DOSEPAK) 4 MG TBPK tablet, Take by mouth daily. Follow package instructions, Disp: 21 tablet, Rfl: 0   CVS D3 2000 units CAPS, Take 2,000 Units by mouth daily., Disp: , Rfl: 11   JANUMET XR [phone] MG TB24, TAKE 1 TABLET BY MOUTH EVERY DAY FOR DIABETES, Disp: , Rfl: 3   omeprazole  (PRILOSEC) 20 MG capsule, Take 20 mg by mouth daily., Disp: , Rfl:    rosuvastatin (CRESTOR) 10 MG tablet, Take 10 mg by mouth daily., Disp: , Rfl:   Allergies  Allergen Reactions   Diclofenac Nausea And Vomiting   Other     Pt has nausea with diflu? Arthritis med?     ROS  As noted in HPI.   Physical Exam  BP 117/76 (BP Location: Right Arm)   Pulse 69   Temp 98.7 F (37.1 C) (Oral)   Resp 17   SpO2 96%   Constitutional: Well developed, well nourished, no acute distress Eyes:  EOMI, conjunctiva normal bilaterally HENT: Normocephalic, atraumatic,mucus membranes moist Respiratory: Normal inspiratory effort Cardiovascular: Normal rate GI: nondistended skin: Healing nail with yellowish discoloration left thumb.  No paronychia     Musculoskeletal: no deformities., tenderness over the right supraspinatus.  Patient has full range of motion of the shoulder.  Sensation distally grossly intact.  Grip strength equal bilaterally.  RP 2+. Neurologic: Alert & oriented x 3, no focal neuro deficits Psychiatric: Speech and behavior appropriate   ED Course   Medications - No data to display  No orders of the defined types were placed in this encounter.  No results found for this or any previous visit (from the past 24 hour(s)). No results found.  ED Clinical Impression  1. Tear of right rotator cuff, unspecified tear extent, unspecified whether traumatic      ED Assessment/Plan  1.  Rotator cuff tear.  Discussed with patient that I am not trained to do intra-articular injections.  Advised patient to try sleeping with a pillow between his arm in his body to help decompress the shoulder at night.  He will increase his Tylenol 1000 mg 3-4 times a day and continue meloxicam and Zanaflex.  Wait-and-see prescription of Medrol Dosepak.  Follow-up with orthopedics as needed.  2.  Discoloration of the nail..  There is no evidence of paronychia today.  Discussed MDM, treatment  plan, and plan for follow-up with patient.  patient agrees with plan.   Meds ordered this encounter  Medications   methylPREDNISolone (MEDROL DOSEPAK) 4 MG TBPK tablet    Sig: Take by mouth daily. Follow package instructions    Dispense:  21 tablet    Refill:  0      *This clinic note was created using Dragon dictation software. Therefore, there may be occasional mistakes despite careful proofreading.  ?    Domenick Gong, MD 12/03/20 [phone]

## 2020-12-02 NOTE — ED Triage Notes (Signed)
Pt reports has torn rotator cuff in right shoulder and scheduled to have surgery Oct 6. Reports wants a cortisone shot in shoulder today for pain. Pt also seen here in August for infected left thumb nail. Wants to make sure healing properly due to yellowish color.

## 2020-12-15 NOTE — H&P (Signed)
PREOPERATIVE H&P  Chief Complaint: RIGHT SHOLDER CARTILAGE DISORDER, OSTEOARTHRITIS, IMPINGEMENT SYNDROME, BICIPITAL TENDONITIS, ROTATOR CUFF TEAR  HPI: Wayne Roberts is a 50 y.o. male who is scheduled for, Procedure(s): SHOULDER ARTHROSCOPY WITH SUBACROMIAL DECOMPRESSION, ROTATOR CUFF REPAIR AND BICEP TENDON REPAIR SHOULDER ARTHROSCOPY WITH DISTAL CLAVICLE RESECTION.   Patient has a past medical history significant for GERD.   Patient is a 50 year-old who had a fall five months ago onto his right shoulder.  He has had pain in his shoulder since then.  He was last seen about six weeks ago and was treated conservatively with a home exercise program and injection.  Today he notes the injection helped for about a week and then the pain returned.  He has been doing his home exercise program without much relief.  He has a lot of pain with overhead activity and at night.  He is right hand dominant.    His symptoms are rated as moderate to severe, and have been worsening.  This is significantly impairing activities of daily living.    Please see clinic note for further details on this patient's care.    He has elected for surgical management.   Past Medical History:  Diagnosis Date   Acid reflux    Chronic back pain    Herniated disc    Hypercholesteremia    Past Surgical History:  Procedure Laterality Date   FOOT SURGERY     Social History   Socioeconomic History   Marital status: Legally Separated    Spouse name: Not on file   Number of children: Not on file   Years of education: Not on file   Highest education level: Not on file  Occupational History   Not on file  Tobacco Use   Smoking status: Former    Packs/day: 0.25    Types: Cigarettes   Smokeless tobacco: Never  Vaping Use   Vaping Use: Never used  Substance and Sexual Activity   Alcohol use: Yes    Alcohol/week: 1.0 standard drink    Types: 1 Standard drinks or equivalent per week   Drug use: No   Sexual  activity: Not on file  Other Topics Concern   Not on file  Social History Narrative   Not on file   Social Determinants of Health   Financial Resource Strain: Not on file  Food Insecurity: Not on file  Transportation Needs: Not on file  Physical Activity: Not on file  Stress: Not on file  Social Connections: Not on file   Family History  Problem Relation Age of Onset   Cancer - Other Father        liver   Cancer - Other Maternal Grandfather    Cancer - Other Maternal Grandmother    Allergies  Allergen Reactions   Diclofenac Nausea And Vomiting   Other     Pt has nausea with diflu? Arthritis med?   Prior to Admission medications   Medication Sig Start Date End Date Taking? Authorizing Provider  CVS D3 2000 units CAPS Take 2,000 Units by mouth daily. 11/28/17   [provider]  JANUMET XR [phone] MG TB24 TAKE 1 TABLET BY MOUTH EVERY DAY FOR DIABETES 01/25/18   [provider]  methylPREDNISolone (MEDROL DOSEPAK) 4 MG TBPK tablet Take by mouth daily. Follow package instructions 12/02/20   Domenick Gong, MD  omeprazole (PRILOSEC) 20 MG capsule Take 20 mg by mouth daily.    [provider]  rosuvastatin (CRESTOR) 10  MG tablet Take 10 mg by mouth daily.    [provider]    ROS: All other systems have been reviewed and were otherwise negative with the exception of those mentioned in the HPI and as above.  Physical Exam: General: Alert, no acute distress Cardiovascular: No pedal edema Respiratory: No cyanosis, no use of accessory musculature GI: No organomegaly, abdomen is soft and non-tender Skin: No lesions in the area of chief complaint Neurologic: Sensation intact distally Psychiatric: Patient is competent for consent with normal mood and affect Lymphatic: No axillary or cervical lymphadenopathy  MUSCULOSKELETAL:  Right shoulder: Active forward elevation to 90, passive to 120.  External rotation of 30.  Positive AC tenderness to  palpation, impingement and O'Brien's.    Imaging: MRI demonstrates a full thickness leading edge supraspinatus tear.  Some fluid around the biceps.  No other obvious abnormality.    Assessment: RIGHT SHOLDER CARTILAGE DISORDER, OSTEOARTHRITIS, IMPINGEMENT SYNDROME, BICIPITAL TENDONITIS, ROTATOR CUFF TEAR  Plan: Plan for Procedure(s): SHOULDER ARTHROSCOPY WITH SUBACROMIAL DECOMPRESSION, ROTATOR CUFF REPAIR AND BICEP TENDON REPAIR SHOULDER ARTHROSCOPY WITH DISTAL CLAVICLE RESECTION  The risks benefits and alternatives were discussed with the patient including but not limited to the risks of nonoperative treatment, versus surgical intervention including infection, bleeding, nerve injury,  blood clots, cardiopulmonary complications, morbidity, mortality, among others, and they were willing to proceed.   The patient acknowledged the explanation, agreed to proceed with the plan and consent was signed.   Operative Plan: Right shoulder scope with subacromial decompression, distal clavicle excision, biceps tenodesis and cuff repair Discharge Medications: Standard DVT Prophylaxis: None Physical Therapy: Outpatient PT Special Discharge needs: Sling. IceMan   Vernetta Honey, PA-C  12/15/2020 11:40 AM

## 2020-12-16 ENCOUNTER — Encounter (HOSPITAL_BASED_OUTPATIENT_CLINIC_OR_DEPARTMENT_OTHER): Payer: Self-pay | Admitting: Orthopaedic Surgery

## 2020-12-16 ENCOUNTER — Other Ambulatory Visit: Payer: Self-pay

## 2020-12-19 ENCOUNTER — Encounter (HOSPITAL_BASED_OUTPATIENT_CLINIC_OR_DEPARTMENT_OTHER)
Admission: RE | Admit: 2020-12-19 | Discharge: 2020-12-19 | Disposition: A | Discharge status: Home / Self Care | Payer: Medicaid Other | Source: Ambulatory Visit | Attending: Orthopaedic Surgery | Admitting: Orthopaedic Surgery

## 2020-12-19 DIAGNOSIS — Z01812 Encounter for preprocedural laboratory examination: Secondary | ICD-10-CM | POA: Diagnosis present

## 2020-12-19 LAB — BASIC METABOLIC PANEL
Anion gap: 9 (ref 5–15)
BUN: 15 mg/dL (ref 6–20)
CO2: 23 mmol/L (ref 22–32)
Calcium: 8.9 mg/dL (ref 8.9–10.3)
Chloride: 104 mmol/L (ref 98–111)
Creatinine, Ser: 1.02 mg/dL (ref 0.61–1.24)
GFR, Estimated: >60 mL/min (ref >60)
Glucose, Bld: 103 mg/dL — ABNORMAL HIGH (ref 70–99)
Potassium: 3.9 mmol/L (ref 3.5–5.1)
Sodium: 136 mmol/L (ref 135–145)

## 2020-12-19 NOTE — Progress Notes (Signed)
      Enhanced Recovery after Surgery for Orthopedics Enhanced Recovery after Surgery is a protocol used to improve the stress on your body and your recovery after surgery.  Patient Instructions  The night before surgery:  No food after midnight. ONLY clear liquids after midnight  The day of surgery (if you do NOT have diabetes):  Drink ONE (1) Pre-Surgery Clear Ensure as directed.   This drink was given to you during your hospital  pre-op appointment visit. The pre-op nurse will instruct you on the time to drink the  Pre-Surgery Ensure depending on your surgery time. Finish the drink at the designated time by the pre-op nurse.  Nothing else to drink after completing the  Pre-Surgery Clear Ensure.  The day of surgery (if you have diabetes): Drink ONE (1) Gatorade 2 (G2) as directed. This drink was given to you during your hospital  pre-op appointment visit.  The pre-op nurse will instruct you on the time to drink the   Gatorade 2 (G2) depending on your surgery time. Color of the Gatorade may vary. Red is not allowed. Nothing else to drink after completing the  Gatorade 2 (G2).         If you have questions, please contact your surgeon's office. Surgical soap given with instructions, pt verbalized understanding. Benzoyl peroxide gel given with written instructions, pt verbalized understanding.  

## 2020-12-21 NOTE — Anesthesia Preprocedure Evaluation (Addendum)
Anesthesia Evaluation  Patient identified by MRN, date of birth, ID band Patient awake    Reviewed: Allergy & Precautions, NPO status , Patient's Chart, lab work & pertinent test results  Airway Mallampati: II  TM Distance: >3 FB Neck ROM: Full    Dental no notable dental hx.    Pulmonary neg pulmonary ROS, former smoker,    Pulmonary exam normal breath sounds clear to auscultation       Cardiovascular hypertension, Pt. on medications negative cardio ROS Normal cardiovascular exam Rhythm:Regular Rate:Normal     Neuro/Psych negative neurological ROS  negative psych ROS   GI/Hepatic Neg liver ROS, GERD  ,  Endo/Other  diabetes (no meds), Well Controlled, Type 2hypercholesterolemia  Renal/GU negative Renal ROS  negative genitourinary   Musculoskeletal  (+) Arthritis , Low back pain   Abdominal   Peds negative pediatric ROS (+)  Hematology negative hematology ROS (+)   Anesthesia Other Findings   Reproductive/Obstetrics negative OB ROS                           Anesthesia Physical Anesthesia Plan  ASA: 2  Anesthesia Plan: General and Regional   Post-op Pain Management: GA combined w/ Regional for post-op pain   Induction: Intravenous  PONV Risk Score and Plan: 2 and Midazolam, Treatment may vary due to age or medical condition, Ondansetron and Dexamethasone  Airway Management Planned: Oral ETT  Additional Equipment: None  Intra-op Plan:   Post-operative Plan: Extubation in OR  Informed Consent: I have reviewed the patients History and Physical, chart, labs and discussed the procedure including the risks, benefits and alternatives for the proposed anesthesia with the patient or authorized representative who has indicated his/her understanding and acceptance.     Dental advisory given  Plan Discussed with: CRNA, Anesthesiologist and Surgeon  Anesthesia Plan Comments:  (Interscalene block with exparel for postop pain control as requested by surgeon. GETA. )      Anesthesia Quick Evaluation

## 2020-12-22 ENCOUNTER — Encounter (HOSPITAL_BASED_OUTPATIENT_CLINIC_OR_DEPARTMENT_OTHER): Payer: Self-pay | Admitting: Orthopaedic Surgery

## 2020-12-22 ENCOUNTER — Encounter (HOSPITAL_BASED_OUTPATIENT_CLINIC_OR_DEPARTMENT_OTHER)
Admission: RE | Disposition: A | Discharge status: Home / Self Care | Payer: Self-pay | Source: Home / Self Care | Attending: Orthopaedic Surgery

## 2020-12-22 ENCOUNTER — Ambulatory Visit (HOSPITAL_BASED_OUTPATIENT_CLINIC_OR_DEPARTMENT_OTHER): Payer: Medicaid Other | Admitting: Anesthesiology

## 2020-12-22 ENCOUNTER — Ambulatory Visit (HOSPITAL_BASED_OUTPATIENT_CLINIC_OR_DEPARTMENT_OTHER)
Admission: RE | Admit: 2020-12-22 | Discharge: 2020-12-22 | Disposition: A | Discharge status: Home / Self Care | Payer: Medicaid Other | Attending: Orthopaedic Surgery | Admitting: Orthopaedic Surgery

## 2020-12-22 ENCOUNTER — Other Ambulatory Visit: Payer: Self-pay

## 2020-12-22 DIAGNOSIS — K219 Gastro-esophageal reflux disease without esophagitis: Secondary | ICD-10-CM | POA: Insufficient documentation

## 2020-12-22 DIAGNOSIS — M7541 Impingement syndrome of right shoulder: Secondary | ICD-10-CM | POA: Insufficient documentation

## 2020-12-22 DIAGNOSIS — I10 Essential (primary) hypertension: Secondary | ICD-10-CM | POA: Insufficient documentation

## 2020-12-22 DIAGNOSIS — E7800 Pure hypercholesterolemia, unspecified: Secondary | ICD-10-CM | POA: Diagnosis not present

## 2020-12-22 DIAGNOSIS — M549 Dorsalgia, unspecified: Secondary | ICD-10-CM | POA: Insufficient documentation

## 2020-12-22 DIAGNOSIS — M75101 Unspecified rotator cuff tear or rupture of right shoulder, not specified as traumatic: Secondary | ICD-10-CM | POA: Insufficient documentation

## 2020-12-22 DIAGNOSIS — M7521 Bicipital tendinitis, right shoulder: Secondary | ICD-10-CM | POA: Insufficient documentation

## 2020-12-22 DIAGNOSIS — Z87891 Personal history of nicotine dependence: Secondary | ICD-10-CM | POA: Diagnosis not present

## 2020-12-22 DIAGNOSIS — E119 Type 2 diabetes mellitus without complications: Secondary | ICD-10-CM | POA: Insufficient documentation

## 2020-12-22 DIAGNOSIS — G8929 Other chronic pain: Secondary | ICD-10-CM | POA: Insufficient documentation

## 2020-12-22 DIAGNOSIS — M19011 Primary osteoarthritis, right shoulder: Secondary | ICD-10-CM | POA: Insufficient documentation

## 2020-12-22 HISTORY — DX: Essential (primary) hypertension: I10

## 2020-12-22 HISTORY — DX: Type 2 diabetes mellitus without complications: E11.9

## 2020-12-22 HISTORY — DX: Unspecified osteoarthritis, unspecified site: M19.90

## 2020-12-22 HISTORY — PX: SHOULDER ARTHROSCOPY WITH DISTAL CLAVICLE RESECTION: SHX5675

## 2020-12-22 HISTORY — PX: SHOULDER ARTHROSCOPY WITH SUBACROMIAL DECOMPRESSION, ROTATOR CUFF REPAIR AND BICEP TENDON REPAIR: SHX5687

## 2020-12-22 LAB — GLUCOSE, CAPILLARY
Glucose-Capillary: 108 mg/dL — ABNORMAL HIGH (ref 70–99)
Glucose-Capillary: 103 mg/dL — ABNORMAL HIGH (ref 70–99)

## 2020-12-22 SURGERY — SHOULDER ARTHROSCOPY WITH SUBACROMIAL DECOMPRESSION, ROTATOR CUFF REPAIR AND BICEP TENDON REPAIR
Anesthesia: Regional | Site: Shoulder | Laterality: Right

## 2020-12-22 MED ORDER — ACETAMINOPHEN 500 MG PO TABS
1000.0000 mg | ORAL_TABLET | Freq: Once | ORAL | Status: AC
  Administered 2020-12-22: 1000 mg via ORAL

## 2020-12-22 MED ORDER — OXYCODONE HCL 5 MG PO TABS: 5.0000 mg | ORAL_TABLET | Freq: Once | ORAL | Status: DC | PRN

## 2020-12-22 MED ORDER — LIDOCAINE 2% (20 MG/ML) 5 ML SYRINGE
INTRAMUSCULAR | Status: DC | PRN
  Administered 2020-12-22: 60 mg via INTRAVENOUS

## 2020-12-22 MED ORDER — FENTANYL CITRATE (PF) 100 MCG/2ML IJ SOLN
INTRAMUSCULAR | Status: AC
  Filled 2020-12-22: qty 2

## 2020-12-22 MED ORDER — ROCURONIUM BROMIDE 10 MG/ML (PF) SYRINGE
PREFILLED_SYRINGE | INTRAVENOUS | Status: AC
  Filled 2020-12-22: qty 10

## 2020-12-22 MED ORDER — ONDANSETRON HCL 4 MG/2ML IJ SOLN
INTRAMUSCULAR | Status: DC | PRN
  Administered 2020-12-22: 4 mg via INTRAVENOUS

## 2020-12-22 MED ORDER — FENTANYL CITRATE (PF) 100 MCG/2ML IJ SOLN
25.0000 ug | INTRAMUSCULAR | Status: DC | PRN
  Administered 2020-12-22: 50 ug via INTRAVENOUS

## 2020-12-22 MED ORDER — CEFAZOLIN SODIUM-DEXTROSE 2-4 GM/100ML-% IV SOLN
INTRAVENOUS | Status: AC
  Filled 2020-12-22: qty 100

## 2020-12-22 MED ORDER — TRANEXAMIC ACID-NACL 1000-0.7 MG/100ML-% IV SOLN
1000.0000 mg | INTRAVENOUS | Status: AC
  Administered 2020-12-22: 1000 mg via INTRAVENOUS

## 2020-12-22 MED ORDER — LIDOCAINE 2% (20 MG/ML) 5 ML SYRINGE
INTRAMUSCULAR | Status: AC
  Filled 2020-12-22: qty 5

## 2020-12-22 MED ORDER — SODIUM CHLORIDE 0.9 % IR SOLN
Status: DC | PRN
  Administered 2020-12-22: 10000 mL

## 2020-12-22 MED ORDER — TRANEXAMIC ACID-NACL 1000-0.7 MG/100ML-% IV SOLN
INTRAVENOUS | Status: AC
  Filled 2020-12-22: qty 100

## 2020-12-22 MED ORDER — SUGAMMADEX SODIUM 500 MG/5ML IV SOLN
INTRAVENOUS | Status: AC
  Filled 2020-12-22: qty 5

## 2020-12-22 MED ORDER — ACETAMINOPHEN 500 MG PO TABS
ORAL_TABLET | ORAL | Status: AC
  Filled 2020-12-22: qty 2

## 2020-12-22 MED ORDER — DEXAMETHASONE SODIUM PHOSPHATE 4 MG/ML IJ SOLN
INTRAMUSCULAR | Status: DC | PRN
Start: 2020-12-22 — End: 2020-12-22
  Administered 2020-12-22: 10 mg via INTRAVENOUS

## 2020-12-22 MED ORDER — PROMETHAZINE HCL 25 MG/ML IJ SOLN: 6.2500 mg | INTRAMUSCULAR | Status: DC | PRN

## 2020-12-22 MED ORDER — MIDAZOLAM HCL 2 MG/2ML IJ SOLN
INTRAMUSCULAR | Status: AC
  Filled 2020-12-22: qty 2

## 2020-12-22 MED ORDER — CEFAZOLIN SODIUM-DEXTROSE 2-4 GM/100ML-% IV SOLN
2.0000 g | INTRAVENOUS | Status: AC
  Administered 2020-12-22: 2 g via INTRAVENOUS

## 2020-12-22 MED ORDER — PROPOFOL 10 MG/ML IV BOLUS
INTRAVENOUS | Status: DC | PRN
  Administered 2020-12-22: 150 mg via INTRAVENOUS

## 2020-12-22 MED ORDER — SUGAMMADEX SODIUM 500 MG/5ML IV SOLN
INTRAVENOUS | Status: DC | PRN
  Administered 2020-12-22: 300 mg via INTRAVENOUS

## 2020-12-22 MED ORDER — CELECOXIB 200 MG PO CAPS: 200.0000 mg | ORAL_CAPSULE | Freq: Two times a day (BID) | ORAL | 0 refills | Status: AC

## 2020-12-22 MED ORDER — ROCURONIUM BROMIDE 10 MG/ML (PF) SYRINGE
PREFILLED_SYRINGE | INTRAVENOUS | Status: DC | PRN
  Administered 2020-12-22: 80 mg via INTRAVENOUS

## 2020-12-22 MED ORDER — DEXAMETHASONE SODIUM PHOSPHATE 10 MG/ML IJ SOLN
INTRAMUSCULAR | Status: AC
  Filled 2020-12-22: qty 1

## 2020-12-22 MED ORDER — ONDANSETRON HCL 4 MG PO TABS: 4.0000 mg | ORAL_TABLET | Freq: Three times a day (TID) | ORAL | 0 refills | Status: AC | PRN

## 2020-12-22 MED ORDER — DROPERIDOL 2.5 MG/ML IJ SOLN: 0.6250 mg | Freq: Once | INTRAMUSCULAR | Status: DC | PRN

## 2020-12-22 MED ORDER — OXYCODONE HCL 5 MG PO TABS: ORAL_TABLET | ORAL | 0 refills | Status: AC

## 2020-12-22 MED ORDER — LACTATED RINGERS IV SOLN: INTRAVENOUS | Status: DC

## 2020-12-22 MED ORDER — ACETAMINOPHEN 500 MG PO TABS: 1000.0000 mg | ORAL_TABLET | Freq: Three times a day (TID) | ORAL | 0 refills | Status: AC

## 2020-12-22 MED ORDER — OXYCODONE HCL 5 MG/5ML PO SOLN
5.0000 mg | Freq: Once | ORAL | Status: DC | PRN
Start: 2020-12-22 — End: 2020-12-22

## 2020-12-22 MED ORDER — ONDANSETRON HCL 4 MG/2ML IJ SOLN
INTRAMUSCULAR | Status: AC
  Filled 2020-12-22: qty 2

## 2020-12-22 SURGICAL SUPPLY — 70 items
AID PSTN UNV HD RSTRNT DISP (MISCELLANEOUS) ×1
ANCH SUT 2.6 FBRSTK 1.7 (Anchor) ×1 IMPLANT
ANCH SUT 2.6 FBRTK 1.7 KNTLS (Anchor) ×1 IMPLANT
ANCH SUT SWLK 19.1X4.75 (Anchor) ×2 IMPLANT
ANCHOR SUT 1.8 FBRTK KNTLS 2SU (Anchor) ×6 IMPLANT
ANCHOR SUT BIO SW 4.75X19.1 (Anchor) ×4 IMPLANT
ANCHOR SUT FBRTK 2.6X1.7X2 (Anchor) ×2 IMPLANT
APL PRP STRL LF DISP 70% ISPRP (MISCELLANEOUS) ×1
BLADE EXCALIBUR 4.0X13 (MISCELLANEOUS) ×2 IMPLANT
BLADE SURG 10 STRL SS (BLADE) IMPLANT
BURR OVAL 8 FLU 4.0X13 (MISCELLANEOUS) ×2 IMPLANT
CANNULA 5.75X71 LONG (CANNULA) IMPLANT
CANNULA PASSPORT 5 (CANNULA) IMPLANT
CANNULA PASSPORT BUTTON 10-40 (CANNULA) ×2 IMPLANT
CANNULA TWIST IN 8.25X7CM (CANNULA) IMPLANT
CHLORAPREP W/TINT 26 (MISCELLANEOUS) ×2 IMPLANT
CLSR STERI-STRIP ANTIMIC 1/2X4 (GAUZE/BANDAGES/DRESSINGS) ×2 IMPLANT
COOLER ICEMAN CLASSIC (MISCELLANEOUS) ×2 IMPLANT
DRAPE IMP U-DRAPE 54X76 (DRAPES) ×2 IMPLANT
DRAPE INCISE IOBAN 66X45 STRL (DRAPES) IMPLANT
DRAPE SHOULDER BEACH CHAIR (DRAPES) ×2 IMPLANT
DRSG PAD ABDOMINAL 8X10 ST (GAUZE/BANDAGES/DRESSINGS) ×2 IMPLANT
DURAPREP 26ML APPLICATOR (WOUND CARE) IMPLANT
DW OUTFLOW CASSETTE/TUBE SET (MISCELLANEOUS) ×2 IMPLANT
GAUZE SPONGE 4X4 12PLY STRL (GAUZE/BANDAGES/DRESSINGS) IMPLANT
GAUZE SPONGE 4X4 16PLY XRAY LF (GAUZE/BANDAGES/DRESSINGS) ×2 IMPLANT
GLOVE SRG 8 PF TXTR STRL LF DI (GLOVE) ×1 IMPLANT
GLOVE SURG ENC MOIS LTX SZ6.5 (GLOVE) ×2 IMPLANT
GLOVE SURG ENC MOIS LTX SZ7 (GLOVE) ×2 IMPLANT
GLOVE SURG LTX SZ8 (GLOVE) ×2 IMPLANT
GLOVE SURG UNDER POLY LF SZ6.5 (GLOVE) ×2 IMPLANT
GLOVE SURG UNDER POLY LF SZ7 (GLOVE) ×4 IMPLANT
GLOVE SURG UNDER POLY LF SZ8 (GLOVE) ×2
GOWN STRL REUS W/ TWL LRG LVL3 (GOWN DISPOSABLE) ×2 IMPLANT
GOWN STRL REUS W/TWL LRG LVL3 (GOWN DISPOSABLE) ×4
GOWN STRL REUS W/TWL XL LVL3 (GOWN DISPOSABLE) ×2 IMPLANT
IMPL FIBERTAK KNTLS 2.6 (Anchor) ×1 IMPLANT
IMPLANT FIBERTAK KNTLS 2.6 (Anchor) ×2 IMPLANT
IV NS IRRIG 3000ML ARTHROMATIC (IV SOLUTION) ×8 IMPLANT
KIT STABILIZATION SHOULDER (MISCELLANEOUS) ×2 IMPLANT
KIT STR SPEAR 1.8 FBRTK DISP (KITS) ×2 IMPLANT
LASSO CRESCENT QUICKPASS (SUTURE) IMPLANT
MANIFOLD NEPTUNE II (INSTRUMENTS) ×2 IMPLANT
NDL SAFETY ECLIPSE 18X1.5 (NEEDLE) IMPLANT
NEEDLE HYPO 18GX1.5 SHARP (NEEDLE)
NEEDLE SCORPION MULTI FIRE (NEEDLE) ×2 IMPLANT
PACK ARTHROSCOPY DSU (CUSTOM PROCEDURE TRAY) ×2 IMPLANT
PACK BASIN DAY SURGERY FS (CUSTOM PROCEDURE TRAY) ×2 IMPLANT
PAD COLD SHLDR WRAP-ON (PAD) ×2 IMPLANT
PAD ORTHO SHOULDER 7X19 LRG (SOFTGOODS) ×2 IMPLANT
PORT APPOLLO RF 90DEGREE MULTI (SURGICAL WAND) ×2 IMPLANT
RESTRAINT HEAD UNIVERSAL NS (MISCELLANEOUS) ×2 IMPLANT
SHEET MEDIUM DRAPE 40X70 STRL (DRAPES) IMPLANT
SLEEVE SCD COMPRESS KNEE MED (STOCKING) ×2 IMPLANT
SLING ARM FOAM STRAP LRG (SOFTGOODS) IMPLANT
SPONGE T-LAP 18X18 ~~LOC~~+RFID (SPONGE) ×2 IMPLANT
SUT FIBERWIRE #2 38 T-5 BLUE (SUTURE)
SUT MNCRL AB 4-0 PS2 18 (SUTURE) ×2 IMPLANT
SUT PDS AB 0 CT 36 (SUTURE) ×2 IMPLANT
SUT PDS AB 1 CT  36 (SUTURE)
SUT PDS AB 1 CT 36 (SUTURE) IMPLANT
SUT TIGER TAPE 7 IN WHITE (SUTURE) IMPLANT
SUTURE FIBERWR #2 38 T-5 BLUE (SUTURE) IMPLANT
SUTURE TAPE TIGERLINK 1.3MM BL (SUTURE) ×1 IMPLANT
SUTURETAPE TIGERLINK 1.3MM BL (SUTURE) ×2
SYR 5ML LL (SYRINGE) IMPLANT
TAPE FIBER 2MM 7IN #2 BLUE (SUTURE) IMPLANT
TOWEL GREEN STERILE FF (TOWEL DISPOSABLE) ×4 IMPLANT
TUBE CONNECTING 20X1/4 (TUBING) ×2 IMPLANT
TUBING ARTHROSCOPY IRRIG 16FT (MISCELLANEOUS) ×2 IMPLANT

## 2020-12-22 NOTE — Progress Notes (Signed)
AssistedDr. Bass with right, ultrasound guided, interscalene  block. Side rails up, monitors on throughout procedure. See vital signs in flow sheet. Tolerated Procedure well.  

## 2020-12-22 NOTE — Anesthesia Procedure Notes (Signed)
Procedure Name: Intubation Date/Time: 12/22/2020 9:18 AM Performed by: Caren Macadam, CRNA Pre-anesthesia Checklist: Patient identified, Emergency Drugs available, Suction available and Patient being monitored Patient Re-evaluated:Patient Re-evaluated prior to induction Oxygen Delivery Method: Circle system utilized Preoxygenation: Pre-oxygenation with 100% oxygen Induction Type: IV induction Ventilation: Mask ventilation without difficulty Laryngoscope Size: Miller and 2 Grade View: Grade I Tube type: Oral Tube size: 7.5 mm Number of attempts: 1 Airway Equipment and Method: Stylet Placement Confirmation: ETT inserted through vocal cords under direct vision, positive ETCO2 and breath sounds checked- equal and bilateral Secured at: 24 cm Tube secured with: Tape Dental Injury: Teeth and Oropharynx as per pre-operative assessment

## 2020-12-22 NOTE — Op Note (Signed)
Orthopaedic Surgery Operative Note (CSN: [id number])  Fransisco Hertz  [birth year] Date of Surgery: 12/22/2020   Diagnoses:  Right shoulder impingement, AC arthrosis, biceps tendonitis and slap tear, cuff tear  Procedure: Arthroscopic extensive debridement Arthroscopic subacromial decompression Arthroscopic rotator cuff repair Arthroscopic biceps tenodesis Arthroscopic distal clavicle excision   Operative Finding Exam under anesthesia: Full motion, no limitation Articular space: No loose bodies, capsule intact, labrum fraying from superior to anterior half Chondral surfaces:Intact, no sign of chondral degeneration on the glenoid or humeral head Biceps: Type 1 slap tear Subscapularis: normal Superior Cuff:Near full thickness tear at leading edge from bursal side Bursal side: 10% or less intact on articular side, bursal side completely off.    Successful completion of the planned procedure.  We performed a takedown and completion before repairing the cuff.  Good quality repair, early therapy encouraged.   Post-operative plan: The patient will be non-weightbearing in a sling 6 weeks.  The patient will be discharged home.  DVT prophylaxis not indicated in ambulatory upper extremity patient without known risk factors.   Pain control with PRN pain medication preferring oral medicines.  Follow up plan will be scheduled in approximately 7 days for incision check and XR.  Post-Op Diagnosis: Same Surgeons:Primary: Bjorn Pippin, MD Assistants:Caroline McBane PA-C Location: [facility] OR ROOM 6 Anesthesia: General with Exparel interscalene block Antibiotics: Ancef Tourniquet time: None Estimated Blood Loss: Minimal Complications: None Specimens: None Implants: Implant Name Type Inv. Item Serial No. Manufacturer Lot No. LRB No. Used Action  ANCHOR SUT 1.8 FBRTK KNTLS 2SU - [id number] Anchor ANCHOR SUT 1.8 FBRTK KNTLS 2SU  [employer] [id number] Right 1 Implanted  ANCHOR SUT 1.8 FBRTK KNTLS 2SU -  [id number] Anchor ANCHOR SUT 1.8 FBRTK KNTLS 2SU  [employer] [id number] Right 1 Implanted  ANCHOR SUT BIO SW 4.75X19.1 - [id number] Anchor ANCHOR SUT BIO SW 4.75X19.1  [employer] [id number] Right 1 Implanted  ANCHOR SUT BIO SW 4.75X19.1 - [id number] Anchor ANCHOR SUT BIO SW 4.75X19.1  [employer] [id number] Right 1 Implanted  ANCHOR SUT FBRTK 2.6X1.7X2 - [id number] Anchor ANCHOR SUT FBRTK 2.6X1.7X2  [employer] [id number] Right 1 Implanted  IMPLANT FIBERTAK KNTLS 2.6 - [id number] Anchor IMPLANT FIBERTAK KNTLS 2.6  [employer] [id number] Right 1 Implanted  ANCHOR SUT 1.8 FBRTK KNTLS 2SU - [id number] Anchor ANCHOR SUT 1.8 FBRTK [employer] [id number] Right 1 Implanted    Indications for Surgery:   Wayne Roberts is a 50 y.o. male with continued shoulder pain refractory to nonoperative measures for extended period of time.    The risks and benefits were explained at length including but not limited to continued pain, cuff failure, biceps tenodesis failure, stiffness, need for further surgery and infection.   Procedure:   Patient was correctly identified in the preoperative holding area and operative site marked.  Patient brought to OR and positioned beachchair on an [place] table ensuring that all bony prominences were padded and the head was in an appropriate location.  Anesthesia was induced and the operative shoulder was prepped and draped in the usual sterile fashion.  Timeout was called preincision.  A standard posterior viewing portal was made after localizing the portal with a spinal needle.  An anterior accessory portal was also made.  After clearing the articular space the camera was positioned in the subacromial space.  Findings above.    Extensive debridement was performed of the anterior interval tissue, labral fraying and the bursa.  Subacromial decompression: We made a lateral portal  with spinal needle guidance. We then proceeded to debride bursal tissue extensively with a shaver and arthrocare  device. At that point we continued to identify the borders of the acromion and identify the spur. We then carefully preserved the deltoid fascia and used a burr to convert the acromion to a Type 1 flat acromion without issue.  Biceps tenodesis: We marked the tendon and then performed a tenotomy and debridement of the stump in the articular space. We then identified the biceps tendon in its groove suprapec with the arthroscope in the lateral portal taking care to move from lateral to medial to avoid injury to the subscapularis. At that point we unroofed the tendon itself and mobilized it. An accessory anterior portal was made in line with the tendon and we grasped it from the anterior superior portal and worked from the accessory anterior portal. Two Fibertak 1.75mm knotless anchors were placed in the groove and the tendon was secured in a luggage loop style fashion with a pass of the limb of suture through the tendon using a scorpion device to avoid pull-through.  Repair was completed with good tension on the tendon.  Residual stump of the tendon was removed after being resected with a RF ablator.  Distal Clavicle resection:  The scope was placed in the subacromial space from the posterior portal.  A hemostat was placed through the anterior portal and we spread at the [facility] joint.  A burr was then inserted and 10 mm of distal clavicle was resected taking care to avoid damage to the capsule around the joint and avoiding overhanging bone posteriorly.    Arthroscopic rotator cuff repair: Once the above is complete we used a shaver as well as a bur to prepare the tuberosity and clear soft tissue so that there was bony bleeding and appropriate bloody flecks for healing.  We then placed 2 Medial Row 2.6 mm fiber tack anchors with knotless mechanisms and tape.  We used a scorpion as well as a link suture to pass all of the sutures from each anchor through the cuff.  We then were able to use the knotless mechanism sutures  to perform a double medial row tiedown compressing the medial row without overtensioning.  We had a dogear slightly anteriorly and we used an extra 1.8 fibertak to secure this with a simple stitch as the suture of the safety stitch from the anterolateral anchor was partially torn.  Once this was complete we cut the switch sutures and performed a speed bridge type repair pulling the tapes over to 2 lateral row 4.75 mm bio composite swivel locks.  This provided compression of the cuff to the tuberosity.   The incisions were closed with absorbable monocryl and steri strips.  A sterile dressing was placed along with a sling. The patient was awoken from general anesthesia and taken to the PACU in stable condition without complication.   Alfonse Alpers, PA-C, present and scrubbed throughout the case, critical for completion in a timely fashion, and for retraction, instrumentation, closure.

## 2020-12-22 NOTE — Transfer of Care (Signed)
Immediate Anesthesia Transfer of Care Note  Patient: Wayne Roberts  Procedure(s) Performed: SHOULDER ARTHROSCOPY WITH SUBACROMIAL DECOMPRESSION, ROTATOR CUFF REPAIR AND BICEP TENODESIS (Right: Shoulder) SHOULDER ARTHROSCOPY WITH DISTAL CLAVICLE RESECTION (Right: Shoulder)  Patient Location: PACU  Anesthesia Type:General  Level of Consciousness: awake  Airway & Oxygen Therapy: Patient Spontanous Breathing and Patient connected to face mask oxygen  Post-op Assessment: Report given to RN and Post -op Vital signs reviewed and stable  Post vital signs: Reviewed and stable  Last Vitals:  Vitals Value Taken Time  BP 133/65 12/22/20 1037  Temp    Pulse 85 12/22/20 1039  Resp 15 12/22/20 1039  SpO2 100 % 12/22/20 1039  Vitals shown include unvalidated device data.  Last Pain:  Vitals:   12/22/20 0806  TempSrc:   PainSc: 0-No pain      Patients Stated Pain Goal: 5 (12/22/20 0746)  Complications: No notable events documented.

## 2020-12-22 NOTE — Anesthesia Postprocedure Evaluation (Signed)
Anesthesia Post Note  Patient: Wayne Roberts  Procedure(s) Performed: SHOULDER ARTHROSCOPY WITH SUBACROMIAL DECOMPRESSION, ROTATOR CUFF REPAIR AND BICEP TENODESIS (Right: Shoulder) SHOULDER ARTHROSCOPY WITH DISTAL CLAVICLE RESECTION (Right: Shoulder)     Patient location during evaluation: PACU Anesthesia Type: Regional and General Level of consciousness: awake Pain management: pain level controlled Vital Signs Assessment: post-procedure vital signs reviewed and stable Respiratory status: spontaneous breathing and respiratory function stable Cardiovascular status: stable Postop Assessment: no apparent nausea or vomiting Anesthetic complications: no   No notable events documented.  Last Vitals:  Vitals:   12/22/20 1115 12/22/20 1130  BP: (!) 143/95 (!) 143/92  Pulse: 82 82  Resp: 18 20  Temp:    SpO2: 96% 95%    Last Pain:  Vitals:   12/22/20 1130  TempSrc:   PainSc: 0-No pain                 Mellody Dance

## 2020-12-22 NOTE — Discharge Instructions (Addendum)
Ramond Marrow MD, MPH Alfonse Alpers, PA-C [facility] [address] [phone] (tel)   [phone] (fax)   POST-OPERATIVE INSTRUCTIONS - SHOULDER ARTHROSCOPY  WOUND CARE You may remove the Operative Dressing on Post-Op Day #3 (72hrs after surgery).   Alternatively if you would like you can leave dressing on until follow-up if within 7-8 days but keep it dry. Leave steri-strips in place until they fall off on their own, usually 2 weeks postop. There may be a small amount of fluid/bleeding leaking at the surgical site.  This is normal; the shoulder is filled with fluid during the procedure and can leak for 24-48hrs after surgery.  You may change/reinforce the bandage as needed.  Use the Cryocuff or Ice as often as possible for the first 7 days, then as needed for pain relief. Always keep a towel, ACE wrap or other barrier between the cooling unit and your skin.  You may shower on Post-Op Day #3. Gently pat the area dry. Do not soak the shoulder in water or submerge it. Keep incisions as dry as possible. Do not go swimming in the pool or ocean until 4 weeks after surgery or when otherwise instructed.    EXERCISES/BRACING Sling should be used at all times until follow-up.  You can remove sling for hygiene.    Please continue to ambulate and do not stay sitting or lying for too long. Perform foot and wrist pumps to assist in circulation.  POST-OP MEDICATIONS- Multimodal approach to pain control In general your pain will be controlled with a combination of substances.  Prescriptions unless otherwise discussed are electronically sent to your pharmacy.  This is a carefully made plan we use to minimize narcotic use.     Celebrex - Anti-inflammatory medication taken on a scheduled basis Acetaminophen - Non-narcotic pain medicine taken on a scheduled basis  Oxycodone - This is a strong narcotic, to be used only on an "as needed" basis for SEVERE pain. Zofran -  take as needed for nausea   FOLLOW-UP If you develop a Fever (?101.5), Redness or Drainage from the surgical incision site, please call our office to arrange for an evaluation. Please call the office to schedule a follow-up appointment for your suture removal, 10-14 days post-operatively.    HELPFUL INFORMATION  If you had a block, it will wear off between 8-24 hrs postop typically.  This is period when your pain may go from nearly zero to the pain you would have had postop without the block.  This is an abrupt transition but nothing dangerous is happening.  You may take an extra dose of narcotic when this happens.  You may be more comfortable sleeping in a semi-seated position the first few nights following surgery.  Keep a pillow propped under the elbow and forearm for comfort.  If you have a recliner type of chair it might be beneficial.  If not that is fine too, but it would be helpful to sleep propped up with pillows behind your operated shoulder as well under your elbow and forearm.  This will reduce pulling on the suture lines.  When dressing, put your operative arm in the sleeve first.  When getting undressed, take your operative arm out last.  Loose fitting, button-down shirts are recommended.  Often in the first days after surgery you may be more comfortable keeping your operative arm under your shirt and not through the sleeve.  You may return to work/school in the next couple of  days when you feel up to it.  Desk work and typing in the sling is fine.  We suggest you use the pain medication the first night prior to going to bed, in order to ease any pain when the anesthesia wears off. You should avoid taking pain medications on an empty stomach as it will make you nauseous.  You should wean off your narcotic medicines as soon as you are able.  Most patients will be off or using minimal narcotics before their first postop appointment.   Do not drink alcoholic beverages or take  illicit drugs when taking pain medications.  It is against the law to drive while taking narcotics.  In some states it is against the law to drive while your arm is in a sling.   Pain medication may make you constipated.  Below are a few solutions to try in this order: Decrease the amount of pain medication if you aren't having pain. Drink lots of decaffeinated fluids. Drink prune juice and/or eat dried prunes  If the first 3 don't work start with additional solutions Take Colace - an over-the-counter stool softener Take Senokot - an over-the-counter laxative Take Miralax - a stronger over-the-counter laxative  For more information including helpful videos and documents visit our website:   [email or web address]   No Tylenol until 1:51 pm   Post Anesthesia Home Care Instructions  Activity: Get plenty of rest for the remainder of the day. A responsible individual must stay with you for 24 hours following the procedure.  For the next 24 hours, DO NOT: -Drive a car -Advertising copywriter -Drink alcoholic beverages -Take any medication unless instructed by your physician -Make any legal decisions or sign important papers.  Meals: Start with liquid foods such as gelatin or soup. Progress to regular foods as tolerated. Avoid greasy, spicy, heavy foods. If nausea and/or vomiting occur, drink only clear liquids until the nausea and/or vomiting subsides. Call your physician if vomiting continues.  Special Instructions/Symptoms: Your throat may feel dry or sore from the anesthesia or the breathing tube placed in your throat during surgery. If this causes discomfort, gargle with warm salt water. The discomfort should disappear within 24 hours.  If you had a scopolamine patch placed behind your ear for the management of post- operative nausea and/or vomiting:  1. The medication in the patch is effective for 72 hours, after which it should be removed.  Wrap  patch in a tissue and discard in the trash. Wash hands thoroughly with soap and water. 2. You may remove the patch earlier than 72 hours if you experience unpleasant side effects which may include dry mouth, dizziness or visual disturbances. 3. Avoid touching the patch. Wash your hands with soap and water after contact with the patch.    Information for Discharge Teaching: EXPAREL (bupivacaine liposome injectable suspension)   Your surgeon or anesthesiologist gave you EXPAREL(bupivacaine) to help control your pain after surgery.  EXPAREL is a local anesthetic that provides pain relief by numbing the tissue around the surgical site. EXPAREL is designed to release pain medication over time and can control pain for up to 72 hours. Depending on how you respond to EXPAREL, you may require less pain medication during your recovery.  Possible side effects: Temporary loss of sensation or ability to move in the area where bupivacaine was injected. Nausea, vomiting, constipation Rarely, numbness and tingling in your mouth or lips, lightheadedness, or anxiety may occur. Call your doctor right away if you think you may  be experiencing any of these sensations, or if you have other questions regarding possible side effects.  Follow all other discharge instructions given to you by your surgeon or nurse. Eat a healthy diet and drink plenty of water or other fluids.  If you return to the hospital for any reason within 96 hours following the administration of EXPAREL, it is important for health care providers to know that you have received this anesthetic. A teal colored band has been placed on your arm with the date, time and amount of EXPAREL you have received in order to alert and inform your health care providers. Please leave this armband in place for the full 96 hours following administration, and then you may remove the band.

## 2020-12-22 NOTE — Interval H&P Note (Signed)
All questions answered, patient wants to proceed with procedure. ? ?

## 2020-12-23 ENCOUNTER — Encounter (HOSPITAL_BASED_OUTPATIENT_CLINIC_OR_DEPARTMENT_OTHER): Payer: Self-pay | Admitting: Orthopaedic Surgery

## 2020-12-28 ENCOUNTER — Other Ambulatory Visit: Payer: Self-pay

## 2020-12-28 ENCOUNTER — Ambulatory Visit: Payer: Medicaid Other | Attending: Orthopaedic Surgery

## 2020-12-28 DIAGNOSIS — G8929 Other chronic pain: Secondary | ICD-10-CM | POA: Diagnosis present

## 2020-12-28 DIAGNOSIS — M6281 Muscle weakness (generalized): Secondary | ICD-10-CM | POA: Diagnosis present

## 2020-12-28 DIAGNOSIS — M25511 Pain in right shoulder: Secondary | ICD-10-CM | POA: Insufficient documentation

## 2020-12-28 DIAGNOSIS — M25611 Stiffness of right shoulder, not elsewhere classified: Secondary | ICD-10-CM | POA: Insufficient documentation

## 2020-12-28 NOTE — Therapy (Addendum)
[facility] Outpatient Rehabilitation [facility] [address] [place], [place], [address] Phone: [phone]   Fax:  [phone]  Physical Therapy Evaluation  Patient Details  Name: Wayne Roberts MRN: [id number] Date of Birth: 1970-10-16 Referring Provider (PT): Bjorn Pippin, MD   Encounter Date: 12/28/2020   PT End of Session - 12/28/20 1302     Visit Number 1    Number of Visits 17    Date for PT Re-Evaluation 02/22/21    Authorization Type [employer]    Authorization Time Period Pending Authorization    PT Start Time 1215    PT Stop Time 1300    PT Time Calculation (min) 45 min    Activity Tolerance Patient tolerated treatment well;Treatment limited secondary to medical complications (Comment)   Shoulder restrictions   Behavior During Therapy [facility] for tasks assessed/performed             Past Medical History:  Diagnosis Date   Acid reflux    Arthritis    Chronic back pain    Diabetes mellitus without complication (HCC)    states no meds now   Herniated disc    Hypercholesteremia    Hypertension     Past Surgical History:  Procedure Laterality Date   FOOT SURGERY Right    SHOULDER ARTHROSCOPY WITH DISTAL CLAVICLE RESECTION Right 12/22/2020   Procedure: SHOULDER ARTHROSCOPY WITH DISTAL CLAVICLE RESECTION;  Surgeon: Bjorn Pippin, MD;  Location: [facility];  Service: Orthopedics;  Laterality: Right;   SHOULDER ARTHROSCOPY WITH SUBACROMIAL DECOMPRESSION, ROTATOR CUFF REPAIR AND BICEP TENDON REPAIR Right 12/22/2020   Procedure: SHOULDER ARTHROSCOPY WITH SUBACROMIAL DECOMPRESSION, ROTATOR CUFF REPAIR AND BICEP TENODESIS;  Surgeon: Bjorn Pippin, MD;  Location: [facility];  Service: Orthopedics;  Laterality: Right;    There were no vitals filed for this visit.    Subjective Assessment - 12/28/20 1220     Subjective Pt reports 6 days s/p R shoulder rotator cuff repair, subacromial decompression, and biceps tenodesis  on 12/22/2020 with Dr. Ramond Marrow. This followed a fall about 1 year ago in which the patient injured his shoulder. Since the surgery, the pt has been using his sling all day and night and has been avoiding R shoulder ROM. Pt reports mild pain, which has been managed using Tylenol and Celebrex.    Patient Stated Goals Return to doing housework/ yardwork, regain shoulder function    Currently in Pain? Yes    Pain Score 4     Pain Location Shoulder    Pain Orientation Right    Pain Descriptors / Indicators Throbbing    Pain Type Surgical pain    Pain Onset In the past 7 days    Pain Frequency Intermittent    Aggravating Factors  Movement    Pain Relieving Factors rest, pain medication    Effect of Pain on Daily Activities See pt goals    Multiple Pain Sites No                [facility] PT Assessment - 12/28/20 0001       Assessment   Medical Diagnosis S/p R shoulder RTC repair, subacromial decompression, and biceps tenodesis    Referring Provider (PT) Bjorn Pippin, MD    Onset Date/Surgical Date 12/22/20    Hand Dominance Right    Next MD Visit 01/01/2021 with his orthopedic surgeon    Prior Therapy No      Precautions   Precautions Other (  comment)    Precaution Comments See Dr. Austin Miles RTC Repair protocol    Required Braces or Orthoses Sling      Restrictions   Weight Bearing Restrictions No      Balance Screen   Has the patient fallen in the past 6 months No    Has the patient had a decrease in activity level because of a fear of falling?  No    Is the patient reluctant to leave their home because of a fear of falling?  No      Home [employer] information [employer] manager residence    Living Arrangements Spouse/significant other    Available Help at Discharge Family    Type of Home House    Home Access Level entry    Home Layout Two level    Alternate Level Stairs-Number of Steps 16    Alternate Level Stairs-Rails Right    Home Equipment [place] - single point       Prior Function   Level of Independence Independent    Vocation On disability      Cognition   Overall Cognitive Status Within Functional Limits for tasks assessed      Observation/Other Assessments   Observations Sutures in place, surgical scars healing well; notable bruising at proximal biceps tendon origin      AROM   Left Shoulder Flexion 180 Degrees    Left Shoulder ABduction 180 Degrees    Left Shoulder Internal Rotation 65 Degrees    Left Shoulder External Rotation 90 Degrees      PROM   Right Shoulder Flexion 40 Degrees   limited by pain   Right Shoulder ABduction 55 Degrees   limited by pain   Right Shoulder Internal Rotation 35 Degrees   limited by pain   Right Shoulder External Rotation 15 Degrees   limited by pain   Left Shoulder Flexion 185 Degrees    Left Shoulder ABduction 185 Degrees    Left Shoulder Internal Rotation 70 Degrees    Left Shoulder External Rotation 100 Degrees    Right Elbow Flexion 145    Right Elbow Extension -35      Strength   Left Shoulder Flexion 5/5    Left Shoulder Extension 5/5    Left Shoulder ABduction 5/5    Left Shoulder Internal Rotation 5/5    Left Shoulder External Rotation 5/5    Left Elbow Flexion 5/5    Left Elbow Extension 5/5    Right Hand Grip (lbs) 85    Left Hand Grip (lbs) 102      Palpation   Palpation comment TTP to R coracoid process, distal clavical                        Objective measurements completed on examination: See above findings.                PT Education - 12/28/20 1255     Education Details Pt educated on Dr. Austin Miles protocol, informing on current shoulder restrictions, educated on POC, and proper form with HEP    Person(s) Educated Patient    Methods Explanation;Demonstration;Handout    Comprehension Verbalized understanding;Returned demonstration              PT Short Term Goals - 12/28/20 1559       PT SHORT TERM GOAL #1   Title Pt will achieve WNL R  shoulder PROM in all planes in order to progress to  phase II of his rehab protocol.    Baseline See flowsheet    Time 4    Period Weeks    Status New    Target Date 01/25/21      PT SHORT TERM GOAL #2   Title Pt will report understanding and adherence to his HEP in order to promote independence in the management of his primary impairments.    Baseline HEP provided at eval.    Time 4    Period Weeks    Status New    Target Date 01/25/21               PT Long Term Goals - 12/28/20 1602       PT LONG TERM GOAL #1   Title Pt will achieve WNL R shoulder AAROM in all planes in order to get dressed without limitation.    Baseline Unable to assess at eval due to shoulder restrictions.    Time 8    Period Weeks    Status New    Target Date 02/22/21      PT LONG TERM GOAL #2   Title Pt will achieve 4+/5 parascapular strength BIL in all planes in order to promote WNL long-term scapular mechanics.    Baseline Unable to assess due to shoulder restrictions.    Time 8    Period Weeks    Status New    Target Date 02/22/21      PT LONG TERM GOAL #3   Title Pt will demonstrate ability to perform rotator cuff isometric strengthening without increase in pain in order to progress to phase III of his rehab protocol.    Baseline Unable to perform at eval due to shoulder restrictions.    Time 8    Period Weeks    Status New    Target Date 02/22/21      PT LONG TERM GOAL #4   Title Pt will demonstrate 5/5 R elbow flexion and extension MMT in order to progress his independent UE strengthening regimen.    Baseline Unable to assess due to surgical restrictions.    Time 8    Period Weeks    Status New    Target Date 02/22/21                    Plan - 12/28/20 1304     Clinical Impression Statement Pt is a pleasant 50yo M who presents 6 days s/p R shoulder subacromial decompression, RTC repair, and biceps tenodesis on 12/22/2020 with Dr. Ramond Marrow. His current restrictions  include no resisted elbow flexion x 4 weeks, no cross body adduction x 8 weeks, shoulder PROM ONLY x6 weeks, no shoulder strengthening/ AROM until 6 weeks. Given his current restrictions, a limited assessment was performed today which included R shoulder/ ELBOW PROM and grip strength. His current impairments include limited R shoulder/ elbow PROM due to pain, decreased R grip strength, and inability to perform active shoulder movements due to his post-op protocol. He will benefit from skilled PT to address his current impairments and return to his prior level of function without limitation.    Personal Factors and Comorbidities Comorbidity 3+    Comorbidities HTN, DM II, hyperlipedemia, BIL knee arthritis    Examination-Activity Limitations Carry;Sleep;Reach Overhead;Transfers;Lift;Dressing    Examination-Participation Restrictions Meal Prep;Cleaning;Community Activity;Yard Work    Stability/Clinical Decision Making Stable/Uncomplicated    Clinical Decision Making Low    Rehab Potential Good    PT Frequency 2x / week  PT Duration 8 weeks    PT Treatment/Interventions ADLs/Self Care Home Management;Cryotherapy;Moist Heat;Neuromuscular re-education;Therapeutic exercise;Therapeutic activities;Taping;Vasopneumatic Device;Patient/family education;Scar mobilization;Passive range of motion;Manual techniques    PT Next Visit Plan Promote R shoulder PROM, scapular mechanics, grip strength, follow Dr. Otis Dials RTC repair protocol    PT Home Exercise Plan [facility]    Consulted and Agree with Plan of Care Patient             Patient will benefit from skilled therapeutic intervention in order to improve the following deficits and impairments:  Decreased range of motion, Impaired UE functional use, Pain, Hypomobility, Impaired flexibility, Decreased strength  Visit Diagnosis: Chronic right shoulder pain  Stiffness of right shoulder, not elsewhere classified  Muscle weakness  (generalized)     Problem List Patient Active Problem List   Diagnosis Date Noted   GERD (gastroesophageal reflux disease) 01/10/2018   Palpitations 01/06/2013   Hyperlipidemia 01/06/2013   Abnormal liver function tests 01/06/2013   Low back pain 11/13/2011   Myofascial muscle pain 11/13/2011    Carmelina Dane, PT, DPT 12/28/20 4:09 PM   [facility] [address] [place], [place], [address] Phone: [phone]   Fax:  [phone]  Name: Marquelle Musgrave MRN: [id number] Date of Birth: [birth year]  Check all possible CPT codes: 97110- Therapeutic Exercise, [phone]- Neuro Re-education, [phone] - Manual Therapy, 97530 - Therapeutic Activities, and 97535 - Self Care  Carmelina Dane, PT, DPT 01/10/21 2:08 PM

## 2020-12-28 NOTE — Patient Instructions (Signed)
  [id number]

## 2021-01-03 ENCOUNTER — Ambulatory Visit: Payer: Medicaid Other

## 2021-01-03 ENCOUNTER — Other Ambulatory Visit: Payer: Self-pay

## 2021-01-03 DIAGNOSIS — M6281 Muscle weakness (generalized): Secondary | ICD-10-CM

## 2021-01-03 DIAGNOSIS — M25611 Stiffness of right shoulder, not elsewhere classified: Secondary | ICD-10-CM

## 2021-01-03 DIAGNOSIS — G8929 Other chronic pain: Secondary | ICD-10-CM

## 2021-01-03 NOTE — Therapy (Signed)
[facility] Outpatient Rehabilitation [facility] [address] [place], [place], [address] Phone: [phone]   Fax:  [phone]  Physical Therapy Treatment  Patient Details  Name: Wayne Roberts MRN: [id number] Date of Birth: 04-21-1970 Referring Provider (PT): Bjorn Pippin, MD   Encounter Date: 01/03/2021   PT End of Session - 01/03/21 1810     Visit Number 2    Number of Visits 17    Date for PT Re-Evaluation 02/22/21    Authorization Type [employer]    Authorization Time Period Pending Authorization    PT Start Time 1745    PT Stop Time 1825    PT Time Calculation (min) 40 min    Activity Tolerance Patient tolerated treatment well;Treatment limited secondary to medical complications (Comment)    Behavior During Therapy [facility] for tasks assessed/performed             Past Medical History:  Diagnosis Date   Acid reflux    Arthritis    Chronic back pain    Diabetes mellitus without complication (HCC)    states no meds now   Herniated disc    Hypercholesteremia    Hypertension     Past Surgical History:  Procedure Laterality Date   FOOT SURGERY Right    SHOULDER ARTHROSCOPY WITH DISTAL CLAVICLE RESECTION Right 12/22/2020   Procedure: SHOULDER ARTHROSCOPY WITH DISTAL CLAVICLE RESECTION;  Surgeon: Bjorn Pippin, MD;  Location: [facility];  Service: Orthopedics;  Laterality: Right;   SHOULDER ARTHROSCOPY WITH SUBACROMIAL DECOMPRESSION, ROTATOR CUFF REPAIR AND BICEP TENDON REPAIR Right 12/22/2020   Procedure: SHOULDER ARTHROSCOPY WITH SUBACROMIAL DECOMPRESSION, ROTATOR CUFF REPAIR AND BICEP TENODESIS;  Surgeon: Bjorn Pippin, MD;  Location: [facility];  Service: Orthopedics;  Laterality: Right;    There were no vitals filed for this visit.   Subjective Assessment - 01/03/21 1748     Subjective Pt reports doing his grip strength exercises and pendulums every day, although he has not been doing his PROM exercises  regularly. He reports low-moderate pain today.    Currently in Pain? Yes    Pain Score 4     Pain Location Shoulder    Pain Orientation Right    Pain Descriptors / Indicators Throbbing    Pain Type Surgical pain    Pain Onset 1 to 4 weeks ago    Pain Frequency Intermittent                               [facility] Adult PT Treatment/Exercise - 01/03/21 0001       Shoulder Exercises: Supine   External Rotation PROM;Right    External Rotation Limitations 2x10 with 5-sec hold at 1st point of pain    Internal Rotation PROM;Right;10 reps    Internal Rotation Limitations 2x10 with 5-sec hold at 1st point of pain    Flexion PROM;Right    Flexion Limitations 2x10 with 5-sec hold at 1st point of pain      Shoulder Exercises: Seated   Retraction Strengthening;Both    Retraction Limitations 3x10 with 5-sec hold    Other Seated Exercises Forward/ backward shoulder rolls 2x10 BIL each    Other Seated Exercises Elbow flexion/ extension AROM with elbows at side on R 3x10 with 3-sec hold at end-range      Hand Exercises   Digiticizer Digi-flex 2.3kg: 2x10 each digit on R with 5 second hold  Manual Therapy   Manual Therapy Passive ROM    Passive ROM R shoulder flexion/ abduction PROM to first point of pain with light distraction at end-range 2x10 with 5-second distraction                       PT Short Term Goals - 12/28/20 1559       PT SHORT TERM GOAL #1   Title Pt will achieve WNL R shoulder PROM in all planes in order to progress to phase II of his rehab protocol.    Baseline See flowsheet    Time 4    Period Weeks    Status New    Target Date 01/25/21      PT SHORT TERM GOAL #2   Title Pt will report understanding and adherence to his HEP in order to promote independence in the management of his primary impairments.    Baseline HEP provided at eval.    Time 4    Period Weeks    Status New    Target Date 01/25/21               PT Long  Term Goals - 12/28/20 1602       PT LONG TERM GOAL #1   Title Pt will achieve WNL R shoulder AAROM in all planes in order to get dressed without limitation.    Baseline Unable to assess at eval due to shoulder restrictions.    Time 8    Period Weeks    Status New    Target Date 02/22/21      PT LONG TERM GOAL #2   Title Pt will achieve 4+/5 parascapular strength BIL in all planes in order to promote WNL long-term scapular mechanics.    Baseline Unable to assess due to shoulder restrictions.    Time 8    Period Weeks    Status New    Target Date 02/22/21      PT LONG TERM GOAL #3   Title Pt will demonstrate ability to perform rotator cuff isometric strengthening without increase in pain in order to progress to phase III of his rehab protocol.    Baseline Unable to perform at eval due to shoulder restrictions.    Time 8    Period Weeks    Status New    Target Date 02/22/21      PT LONG TERM GOAL #4   Title Pt will demonstrate 5/5 R elbow flexion and extension MMT in order to progress his independent UE strengthening regimen.    Baseline Unable to assess due to surgical restrictions.    Time 8    Period Weeks    Status New    Target Date 02/22/21                   Plan - 01/03/21 1811     Clinical Impression Statement Pt responded well to all interventions today, demonstrating good form and no increase in pain with selected exercises. He demonstrates pain-free R shoulder elevation PROM to about 120 degrees. He adds that distraction was therapeutic. He also demonstrates full R elbow AROM into extension and flexion with no pain. He will continue to benefit from skilled PT to address his primary impairments and return to his prior level of function without limitation. Due to his POC not being signed off on by his referring provider, his session today will carry no charges.    Personal Factors and Comorbidities  Comorbidity 3+    Comorbidities HTN, DM II, hyperlipedemia, BIL  knee arthritis    Examination-Activity Limitations Carry;Sleep;Reach Overhead;Transfers;Lift;Dressing    Examination-Participation Restrictions Meal Prep;Cleaning;Community Activity;Yard Work    Stability/Clinical Decision Making Stable/Uncomplicated    Clinical Decision Making Low    Rehab Potential Good    PT Frequency 2x / week    PT Duration 8 weeks    PT Treatment/Interventions ADLs/Self Care Home Management;Cryotherapy;Moist Heat;Neuromuscular re-education;Therapeutic exercise;Therapeutic activities;Taping;Vasopneumatic Device;Patient/family education;Scar mobilization;Passive range of motion;Manual techniques    PT Next Visit Plan Promote R shoulder PROM, scapular mechanics, grip strength, follow Dr. Otis Dials RTC repair protocol    PT Home Exercise Plan [facility]    Consulted and Agree with Plan of Care Patient             Patient will benefit from skilled therapeutic intervention in order to improve the following deficits and impairments:  Decreased range of motion, Impaired UE functional use, Pain, Hypomobility, Impaired flexibility, Decreased strength  Visit Diagnosis: Chronic right shoulder pain  Stiffness of right shoulder, not elsewhere classified  Muscle weakness (generalized)     Problem List Patient Active Problem List   Diagnosis Date Noted   GERD (gastroesophageal reflux disease) 01/10/2018   Palpitations 01/06/2013   Hyperlipidemia 01/06/2013   Abnormal liver function tests 01/06/2013   Low back pain 11/13/2011   Myofascial muscle pain 11/13/2011    Carmelina Dane, PT, DPT 01/03/21 6:23 PM   [facility] Outpatient Rehabilitation [facility] [address] [place], [place], [address] Phone: [phone]   Fax:  [phone]  Name: Savan Ruta MRN: [id number] Date of Birth: 1970-10-06

## 2021-01-05 ENCOUNTER — Other Ambulatory Visit: Payer: Self-pay

## 2021-01-05 ENCOUNTER — Encounter: Payer: Self-pay | Admitting: Physical Therapy

## 2021-01-05 ENCOUNTER — Ambulatory Visit: Payer: Medicaid Other | Admitting: Physical Therapy

## 2021-01-05 DIAGNOSIS — G8929 Other chronic pain: Secondary | ICD-10-CM

## 2021-01-05 DIAGNOSIS — M6281 Muscle weakness (generalized): Secondary | ICD-10-CM

## 2021-01-05 DIAGNOSIS — M25611 Stiffness of right shoulder, not elsewhere classified: Secondary | ICD-10-CM

## 2021-01-05 NOTE — Therapy (Signed)
[place], [place], [address] Phone: [phone]   Fax:  [phone]  Physical Therapy Treatment  Patient Details  Name: Wayne Roberts MRN: [id number] Date of Birth: [birth year] Referring Provider (PT): Hiram Gash, MD   Encounter Date: 01/05/2021   PT End of Session - 01/05/21 1236     Visit Number 3    Number of Visits 17    Date for PT Re-Evaluation 02/22/21    Authorization Type [employer]    Authorization Time Period Pending Authorization- check for signed POC    PT Start Time 1230    PT Stop Time 1300    PT Time Calculation (min) 30 min             Past Medical History:  Diagnosis Date   Acid reflux    Arthritis    Chronic back pain    Diabetes mellitus without complication ([place])    states no meds now   Herniated disc    Hypercholesteremia    Hypertension     Past Surgical History:  Procedure Laterality Date   FOOT SURGERY Right    SHOULDER ARTHROSCOPY WITH DISTAL CLAVICLE RESECTION Right 12/22/2020   Procedure: SHOULDER ARTHROSCOPY WITH DISTAL CLAVICLE RESECTION;  Surgeon: Hiram Gash, MD;  Location: [facility];  Service: Orthopedics;  Laterality: Right;   SHOULDER ARTHROSCOPY WITH SUBACROMIAL DECOMPRESSION, ROTATOR CUFF REPAIR AND BICEP TENDON REPAIR Right 12/22/2020   Procedure: SHOULDER ARTHROSCOPY WITH SUBACROMIAL DECOMPRESSION, ROTATOR CUFF REPAIR AND BICEP TENODESIS;  Surgeon: Hiram Gash, MD;  Location: [facility];  Service: Orthopedics;  Laterality: Right;    There were no vitals filed for this visit.   Subjective Assessment - 01/05/21 1234     Subjective 3-4/10 soreness at surgical incision areas on shoulder, otherwise no pain.    Currently in Pain? Yes    Pain Score 4     Pain Location Shoulder    Pain Orientation Right    Pain Descriptors / Indicators Sore    Pain Type Surgical pain    Aggravating Factors  movement    Pain  Relieving Factors rest, pain meds                [facility] PT Assessment - 01/05/21 0001       PROM   Right Shoulder Flexion 125 Degrees    Right Shoulder ABduction 60 Degrees   limited by protocol              [facility] Adult PT Treatment/Exercise - 01/05/21 0001       Self-Care   Self-Care Other Self-Care Comments    Other Self-Care Comments  Verbal review of post-op restrictons- pt unable to recall what his restrictions are other than "do not hurt it"      Shoulder Exercises: Supine   Other Supine Exercises towel squeezes for grip 5 sec x 20      Shoulder Exercises: Seated   Retraction Strengthening;Both    Retraction Limitations 3x10 with 5-sec hold    Other Seated Exercises passive shoulder flexion arms on mat table seated in stool - rolling back - cautioned to remain below 140 degrees.      Shoulder Exercises: Standing   Other Standing Exercises pendulums with cues to keep passive, use body rocking to create shoulder motion      Manual Therapy   Passive ROM R shoulder passive flexion to 125, abduction60 (limited protocol) ER 40(limited by  protocol), passive elbow extension                       PT Short Term Goals - 12/28/20 1559       PT SHORT TERM GOAL #1   Title Pt will achieve WNL R shoulder PROM in all planes in order to progress to phase II of his rehab protocol.    Baseline See flowsheet    Time 4    Period Weeks    Status New    Target Date 01/25/21      PT SHORT TERM GOAL #2   Title Pt will report understanding and adherence to his HEP in order to promote independence in the management of his primary impairments.    Baseline HEP provided at eval.    Time 4    Period Weeks    Status New    Target Date 01/25/21               PT Long Term Goals - 12/28/20 1602       PT LONG TERM GOAL #1   Title Pt will achieve WNL R shoulder AAROM in all planes in order to get dressed without limitation.    Baseline Unable to assess at eval  due to shoulder restrictions.    Time 8    Period Weeks    Status New    Target Date 02/22/21      PT LONG TERM GOAL #2   Title Pt will achieve 4+/5 parascapular strength BIL in all planes in order to promote WNL long-term scapular mechanics.    Baseline Unable to assess due to shoulder restrictions.    Time 8    Period Weeks    Status New    Target Date 02/22/21      PT LONG TERM GOAL #3   Title Pt will demonstrate ability to perform rotator cuff isometric strengthening without increase in pain in order to progress to phase III of his rehab protocol.    Baseline Unable to perform at eval due to shoulder restrictions.    Time 8    Period Weeks    Status New    Target Date 02/22/21      PT LONG TERM GOAL #4   Title Pt will demonstrate 5/5 R elbow flexion and extension MMT in order to progress his independent UE strengthening regimen.    Baseline Unable to assess due to surgical restrictions.    Time 8    Period Weeks    Status New    Target Date 02/22/21                   Plan - 01/05/21 1310     Clinical Impression Statement Pt arrives reporting min soreness and progressing PROM. His abduction and ER PROM limits have been met and is flexion PROM is 125 wth max to be 140 first 6 weeks per protocol. Reviewed pendulums , scap squeezes and gripping. Began seated table PROM with cues to avoid stretching flexion past 140 degrees per protocol. Reviewed pt restrictions as he was unable to verbalize and specific restrictions when prompted. He declined the need for ice post session and has an ice machine at home.    PT Treatment/Interventions ADLs/Self Care Home Management;Cryotherapy;Moist Heat;Neuromuscular re-education;Therapeutic exercise;Therapeutic activities;Taping;Vasopneumatic Device;Patient/family education;Scar mobilization;Passive range of motion;Manual techniques    PT Next Visit Plan Promote R shoulder PROM, scapular mechanics, grip strength, follow Dr. Londell Moh  RTC repair protocol (  PROM only first  6 weeks- flexion 140, ER 40 (from neutral) and abduction 60    PT Home Exercise Plan [id number]    Consulted and Agree with Plan of Care Patient             Patient will benefit from skilled therapeutic intervention in order to improve the following deficits and impairments:  Decreased range of motion, Impaired UE functional use, Pain, Hypomobility, Impaired flexibility, Decreased strength  Visit Diagnosis: Chronic right shoulder pain  Muscle weakness (generalized)  Stiffness of right shoulder, not elsewhere classified     Problem List Patient Active Problem List   Diagnosis Date Noted   GERD (gastroesophageal reflux disease) 01/10/2018   Palpitations 01/06/2013   Hyperlipidemia 01/06/2013   Abnormal liver function tests 01/06/2013   Low back pain 11/13/2011   Myofascial muscle pain 11/13/2011    Dorene Ar, PTA 01/05/2021, 1:30 PM  [facility] [address] [place], [place], [address] Phone: [phone]   Fax:  [phone]  Name: Wayne Roberts MRN: [id number] Date of Birth: 1970-06-08

## 2021-01-09 ENCOUNTER — Ambulatory Visit: Payer: Medicaid Other

## 2021-01-09 ENCOUNTER — Other Ambulatory Visit: Payer: Self-pay

## 2021-01-09 DIAGNOSIS — M25611 Stiffness of right shoulder, not elsewhere classified: Secondary | ICD-10-CM

## 2021-01-09 DIAGNOSIS — M6281 Muscle weakness (generalized): Secondary | ICD-10-CM

## 2021-01-09 DIAGNOSIS — G8929 Other chronic pain: Secondary | ICD-10-CM

## 2021-01-09 NOTE — Therapy (Signed)
[facility] [address] [place], [place], [address] Phone: [phone]   Fax:  [phone]  Physical Therapy Treatment  Patient Details  Name: Wayne Roberts MRN: [id number] Date of Birth: 26-Nov-1970 Referring Provider (PT): Bjorn Pippin, MD   Encounter Date: 01/09/2021   PT End of Session - 01/09/21 0940     Visit Number 4    Number of Visits 17    Date for PT Re-Evaluation 02/22/21    Authorization Type [employer]    Authorization Time Period Pending Authorization- check for signed POC    PT Start Time 0917    PT Stop Time 1000    PT Time Calculation (min) 43 min    Activity Tolerance Patient tolerated treatment well;Treatment limited secondary to medical complications (Comment)    Behavior During Therapy [facility] for tasks assessed/performed             Past Medical History:  Diagnosis Date   Acid reflux    Arthritis    Chronic back pain    Diabetes mellitus without complication ([facility])    states no meds now   Herniated disc    Hypercholesteremia    Hypertension     Past Surgical History:  Procedure Laterality Date   FOOT SURGERY Right    SHOULDER ARTHROSCOPY WITH DISTAL CLAVICLE RESECTION Right 12/22/2020   Procedure: SHOULDER ARTHROSCOPY WITH DISTAL CLAVICLE RESECTION;  Surgeon: Bjorn Pippin, MD;  Location: [facility];  Service: Orthopedics;  Laterality: Right;   SHOULDER ARTHROSCOPY WITH SUBACROMIAL DECOMPRESSION, ROTATOR CUFF REPAIR AND BICEP TENDON REPAIR Right 12/22/2020   Procedure: SHOULDER ARTHROSCOPY WITH SUBACROMIAL DECOMPRESSION, ROTATOR CUFF REPAIR AND BICEP TENODESIS;  Surgeon: Bjorn Pippin, MD;  [facility];  Service: Orthopedics;  Laterality: Right;    There were no vitals filed for this visit.   Subjective Assessment - 01/09/21 0917     Subjective Pt reports that his home exercises have been going well. He reports no pain today.    Currently in Pain?  No/denies    Pain Score 0-No pain                               [facility] Adult PT Treatment/Exercise - 01/09/21 0001       Shoulder Exercises: Seated   Elevation Strengthening    Elevation Limitations Shoulder shrugs 3x10 with 5-sec holds    Retraction Limitations 3x10 with 5-sec hold    Protraction Limitations 3x10 with 5-sec holds    Other Seated Exercises BIL Shoulder depression 3x10 with 5-sec hold    Other Seated Exercises passive shoulder flexion arms on physioball at mat table seated in stool - rolling back - cautioned to remain below 140 degrees.      Shoulder Exercises: Standing   Other Standing Exercises pendulums 3x30 CW and CCW on R      Hand Exercises   Digiticizer Digi-flex 2.3kg: 2x10 each digit on R with 5 second hold      Manual Therapy   Manual Therapy Passive ROM;Joint mobilization    Joint Mobilization Grade 2 AP/PA/Inferior R GHJ mobilization    Passive ROM R shoulder flexion PROM to 130 degrees/ abduction PROM to 60 degrees with light distraction at end-range 2x10 with 5-second distraction                       PT Short Term Goals -  12/28/20 1559       PT SHORT TERM GOAL #1   Title Pt will achieve WNL R shoulder PROM in all planes in order to progress to phase II of his rehab protocol.    Baseline See flowsheet    Time 4    Period Weeks    Status New    Target Date 01/25/21      PT SHORT TERM GOAL #2   Title Pt will report understanding and adherence to his HEP in order to promote independence in the management of his primary impairments.    Baseline HEP provided at eval.    Time 4    Period Weeks    Status New    Target Date 01/25/21               PT Long Term Goals - 12/28/20 1602       PT LONG TERM GOAL #1   Title Pt will achieve WNL R shoulder AAROM in all planes in order to get dressed without limitation.    Baseline Unable to assess at eval due to shoulder restrictions.    Time 8    Period Weeks     Status New    Target Date 02/22/21      PT LONG TERM GOAL #2   Title Pt will achieve 4+/5 parascapular strength BIL in all planes in order to promote WNL long-term scapular mechanics.    Baseline Unable to assess due to shoulder restrictions.    Time 8    Period Weeks    Status New    Target Date 02/22/21      PT LONG TERM GOAL #3   Title Pt will demonstrate ability to perform rotator cuff isometric strengthening without increase in pain in order to progress to phase III of his rehab protocol.    Baseline Unable to perform at eval due to shoulder restrictions.    Time 8    Period Weeks    Status New    Target Date 02/22/21      PT LONG TERM GOAL #4   Title Pt will demonstrate 5/5 R elbow flexion and extension MMT in order to progress his independent UE strengthening regimen.    Baseline Unable to assess due to surgical restrictions.    Time 8    Period Weeks    Status New    Target Date 02/22/21                   Plan - 01/09/21 0941     Clinical Impression Statement Pt responded well to all interventions today, demonstrating excellent form and no pain with selected exercises. Of note, he reports a therapeutic response to GHJ long-axis distraction with PROM. He also responded well to low-grade GHJ mobilization. He leaves with 0/10 pain and no report of fatigue. He will continue to benefit from skilled PT to address his primary impairments and return to his prior level of function with less limitation.    Personal Factors and Comorbidities Comorbidity 3+    Comorbidities HTN, DM II, hyperlipedemia, BIL knee arthritis    Examination-Activity Limitations Carry;Sleep;Reach Overhead;Transfers;Lift;Dressing    Examination-Participation Restrictions Meal Prep;Cleaning;Community Activity;Yard Work    Stability/Clinical Decision Making Stable/Uncomplicated    Clinical Decision Making Low    Rehab Potential Good    PT Frequency 2x / week    PT Duration 8 weeks    PT  Treatment/Interventions ADLs/Self Care Home Management;Cryotherapy;Moist Heat;Neuromuscular re-education;Therapeutic exercise;Therapeutic activities;Taping;Vasopneumatic Device;Patient/family education;Scar  mobilization;Passive range of motion;Manual techniques    PT Next Visit Plan Promote R shoulder PROM, scapular mechanics, grip strength, follow Dr. Denton Brick Varkey's RTC repair protocol (PROM only first  6 weeks- flexion 140, ER 40 (from neutral) and abduction 60    PT Home Exercise Plan [facility]    Consulted and Agree with Plan of Care Patient             Patient will benefit from skilled therapeutic intervention in order to improve the following deficits and impairments:  Decreased range of motion, Impaired UE functional use, Pain, Hypomobility, Impaired flexibility, Decreased strength  Visit Diagnosis: Chronic right shoulder pain  Muscle weakness (generalized)  Stiffness of right shoulder, not elsewhere classified     Problem List Patient Active Problem List   Diagnosis Date Noted   GERD (gastroesophageal reflux disease) 01/10/2018   Palpitations 01/06/2013   Hyperlipidemia 01/06/2013   Abnormal liver function tests 01/06/2013   Low back pain 11/13/2011   Myofascial muscle pain 11/13/2011    Carmelina Dane, PT, DPT 01/09/21 4:37 PM   [facility] [address] [place], [place], [address] Phone: [phone]   Fax:  [phone]  Name: Wayne Roberts MRN: [id number] Date of Birth: [birth year]

## 2021-01-11 ENCOUNTER — Other Ambulatory Visit: Payer: Self-pay

## 2021-01-11 ENCOUNTER — Encounter: Payer: Self-pay | Admitting: Physical Therapy

## 2021-01-11 ENCOUNTER — Ambulatory Visit: Payer: Medicaid Other | Admitting: Physical Therapy

## 2021-01-11 DIAGNOSIS — M25511 Pain in right shoulder: Secondary | ICD-10-CM

## 2021-01-11 DIAGNOSIS — M6281 Muscle weakness (generalized): Secondary | ICD-10-CM

## 2021-01-11 DIAGNOSIS — M25611 Stiffness of right shoulder, not elsewhere classified: Secondary | ICD-10-CM

## 2021-01-11 NOTE — Therapy (Signed)
[facility] [address] [place], [place], [address] Phone: [phone]   Fax:  [phone]  Physical Therapy Treatment  Patient Details  Name: Wayne Roberts MRN: [id number] Date of Birth: [birth year] Referring Provider (PT): Bjorn Pippin, MD   Encounter Date: 01/11/2021   PT End of Session - 01/11/21 0852     Visit Number 5    Number of Visits 17    Date for PT Re-Evaluation 02/22/21    Authorization Type [employer]    Authorization Time Period Pending Authorization-    PT Start Time 0850    PT Stop Time 0918    PT Time Calculation (min) 28 min             Past Medical History:  Diagnosis Date   Acid reflux    Arthritis    Chronic back pain    Diabetes mellitus without complication ([facility])    states no meds now   Herniated disc    Hypercholesteremia    Hypertension     Past Surgical History:  Procedure Laterality Date   FOOT SURGERY Right    SHOULDER ARTHROSCOPY WITH DISTAL CLAVICLE RESECTION Right 12/22/2020   Procedure: SHOULDER ARTHROSCOPY WITH DISTAL CLAVICLE RESECTION;  Surgeon: Bjorn Pippin, MD;  Location: [facility];  Service: Orthopedics;  Laterality: Right;   SHOULDER ARTHROSCOPY WITH SUBACROMIAL DECOMPRESSION, ROTATOR CUFF REPAIR AND BICEP TENDON REPAIR Right 12/22/2020   Procedure: SHOULDER ARTHROSCOPY WITH SUBACROMIAL DECOMPRESSION, ROTATOR CUFF REPAIR AND BICEP TENODESIS;  Surgeon: Bjorn Pippin, MD;  Location: [facility];  Service: Orthopedics;  Laterality: Right;    There were no vitals filed for this visit.   Subjective Assessment - 01/11/21 0851     Subjective 2-3/10 achey right shoulder, probably because of the rain.    Currently in Pain? Yes    Pain Score 3     Pain Location Shoulder    Pain Orientation Right    Pain Descriptors / Indicators Aching    Pain Type Surgical pain    Aggravating Factors  movememtn    Pain Relieving Factors rest, pain meds                [facility] Adult PT Treatment/Exercise - 01/11/21 0001       Shoulder Exercises: Seated   Elevation Limitations Shoulder shrugs 3x10 with 5-sec holds    Retraction Limitations 3x10 with 5-sec hold    Protraction Limitations 3x10 with 5-sec holds    Other Seated Exercises BIL Shoulder depression 3x10 with 5-sec hold    Other Seated Exercises passive shoulder flexion arms on mat table seated in stool - rolling back - cautioned to remain below 140 degrees., elbow flexion and extension      Shoulder Exercises: Standing   Other Standing Exercises pendulums 3x30 CW and CCW on R      Hand Exercises   Digiticizer Digi-flex 2.3kg: 2x10 each digit on R with 5 second hold      Manual Therapy   Passive ROM R shoulder passive flexion to 140, abduction60 (limited protocol) ER 40(limited by protocol), passive elbow extension                       PT Short Term Goals - 12/28/20 1559       PT SHORT TERM GOAL #1   Title Pt will achieve WNL R shoulder PROM in all planes in order to progress to phase II of  his rehab protocol.    Baseline See flowsheet    Time 4    Period Weeks    Status New    Target Date 01/25/21      PT SHORT TERM GOAL #2   Title Pt will report understanding and adherence to his HEP in order to promote independence in the management of his primary impairments.    Baseline HEP provided at eval.    Time 4    Period Weeks    Status New    Target Date 01/25/21               PT Long Term Goals - 12/28/20 1602       PT LONG TERM GOAL #1   Title Pt will achieve WNL R shoulder AAROM in all planes in order to get dressed without limitation.    Baseline Unable to assess at eval due to shoulder restrictions.    Time 8    Period Weeks    Status New    Target Date 02/22/21      PT LONG TERM GOAL #2   Title Pt will achieve 4+/5 parascapular strength BIL in all planes in order to promote WNL long-term scapular mechanics.    Baseline Unable to  assess due to shoulder restrictions.    Time 8    Period Weeks    Status New    Target Date 02/22/21      PT LONG TERM GOAL #3   Title Pt will demonstrate ability to perform rotator cuff isometric strengthening without increase in pain in order to progress to phase III of his rehab protocol.    Baseline Unable to perform at eval due to shoulder restrictions.    Time 8    Period Weeks    Status New    Target Date 02/22/21      PT LONG TERM GOAL #4   Title Pt will demonstrate 5/5 R elbow flexion and extension MMT in order to progress his independent UE strengthening regimen.    Baseline Unable to assess due to surgical restrictions.    Time 8    Period Weeks    Status New    Target Date 02/22/21                   Plan - 01/11/21 0918     Clinical Impression Statement Pt reports achiness today due to rainy weather. Able to continue with PROM and pt easily achieves end range protocol limits for 6 week protocol. Continued with grip strength, wrist and elbow AROM. He demonstrates independence with scap AROM and pendulums. He reported no increased pain post session and declined modality.    PT Treatment/Interventions ADLs/Self Care Home Management;Cryotherapy;Moist Heat;Neuromuscular re-education;Therapeutic exercise;Therapeutic activities;Taping;Vasopneumatic Device;Patient/family education;Scar mobilization;Passive range of motion;Manual techniques    PT Next Visit Plan Promote R shoulder PROM, scapular mechanics, grip strength, follow Dr. Denton Brick Varkey's RTC repair protocol (PROM only first  6 weeks- flexion 140, ER 40 (from neutral) and abduction 60    PT Home Exercise Plan [name]             Patient will benefit from skilled therapeutic intervention in order to improve the following deficits and impairments:  Decreased range of motion, Impaired UE functional use, Pain, Hypomobility, Impaired flexibility, Decreased strength  Visit Diagnosis: Chronic right shoulder  pain  Muscle weakness (generalized)  Stiffness of right shoulder, not elsewhere classified     Problem List Patient Active Problem List   Diagnosis  Date Noted   GERD (gastroesophageal reflux disease) 01/10/2018   Palpitations 01/06/2013   Hyperlipidemia 01/06/2013   Abnormal liver function tests 01/06/2013   Low back pain 11/13/2011   Myofascial muscle pain 11/13/2011    Sherrie Mustache, PTA 01/11/2021, 9:20 AM  [facility] [address] [place], [place], [address] Phone: [phone]   Fax:  [phone]  Name: Wayne Roberts MRN: [id number] Date of Birth: 08/31/1970

## 2021-01-16 ENCOUNTER — Other Ambulatory Visit: Payer: Self-pay

## 2021-01-16 ENCOUNTER — Ambulatory Visit: Payer: Medicaid Other

## 2021-01-16 DIAGNOSIS — M25511 Pain in right shoulder: Secondary | ICD-10-CM | POA: Diagnosis not present

## 2021-01-16 DIAGNOSIS — M25611 Stiffness of right shoulder, not elsewhere classified: Secondary | ICD-10-CM

## 2021-01-16 DIAGNOSIS — G8929 Other chronic pain: Secondary | ICD-10-CM

## 2021-01-16 DIAGNOSIS — M6281 Muscle weakness (generalized): Secondary | ICD-10-CM

## 2021-01-16 NOTE — Therapy (Signed)
[facility] Outpatient Rehabilitation [facility] [address] [place], [place], [address] Phone: [phone]   Fax:  [phone]  Physical Therapy Treatment  Patient Details  Name: Wayne Roberts MRN: [id number] Date of Birth: 03/19/1971 Referring Provider (PT): Bjorn Pippin, MD   Encounter Date: 01/16/2021   PT End of Session - 01/16/21 1137     Visit Number 6    Number of Visits 17    Date for PT Re-Evaluation 02/22/21    Authorization Type [employer]    Authorization Time Period Pending Authorization-    PT Start Time 1140    PT Stop Time 1211    PT Time Calculation (min) 31 min    Activity Tolerance Patient tolerated treatment well    Behavior During Therapy WFL for tasks assessed/performed             Past Medical History:  Diagnosis Date   Acid reflux    Arthritis    Chronic back pain    Diabetes mellitus without complication (HCC)    states no meds now   Herniated disc    Hypercholesteremia    Hypertension     Past Surgical History:  Procedure Laterality Date   FOOT SURGERY Right    SHOULDER ARTHROSCOPY WITH DISTAL CLAVICLE RESECTION Right 12/22/2020   Procedure: SHOULDER ARTHROSCOPY WITH DISTAL CLAVICLE RESECTION;  Surgeon: Bjorn Pippin, MD;  Location: [facility];  Service: Orthopedics;  Laterality: Right;   SHOULDER ARTHROSCOPY WITH SUBACROMIAL DECOMPRESSION, ROTATOR CUFF REPAIR AND BICEP TENDON REPAIR Right 12/22/2020   Procedure: SHOULDER ARTHROSCOPY WITH SUBACROMIAL DECOMPRESSION, ROTATOR CUFF REPAIR AND BICEP TENODESIS;  Surgeon: Bjorn Pippin, MD;  Location: [facility];  Service: Orthopedics;  Laterality: Right;    There were no vitals filed for this visit.   Subjective Assessment - 01/16/21 1142     Subjective Patient reports no problems with the shoulder right now. No pain currently. He reports compliance with HEP and is maintaining his precautions/restrictions.    Currently in Pain? No/denies                 [facility] PT Assessment - 01/16/21 0001       Observation/Other Assessments   Skin Integrity incisions/ port hole sites are healing well with no signs of infection.                           [facility] Adult PT Treatment/Exercise - 01/16/21 0001       Self-Care   Self-Care Other Self-Care Comments    Other Self-Care Comments  see patient      Manual Therapy   Manual therapy comments scar tissue mobilizations    Passive ROM R shoulder passive flexion to 140, abduction60 (limited protocol) ER 40(limited by protocol), passive shoulder IR to tolerance, passive elbow extension,                     PT Education - 01/16/21 1217     Education Details education on modalities for pain control, recommendation for bathing (avoid scrubbing over incision site), education on appropriate sling placement.    Person(s) Educated Patient    Methods Explanation    Comprehension Verbalized understanding              PT Short Term Goals - 12/28/20 1559       PT SHORT TERM GOAL #1   Title Pt will achieve WNL R shoulder  PROM in all planes in order to progress to phase II of his rehab protocol.    Baseline See flowsheet    Time 4    Period Weeks    Status New    Target Date 01/25/21      PT SHORT TERM GOAL #2   Title Pt will report understanding and adherence to his HEP in order to promote independence in the management of his primary impairments.    Baseline HEP provided at eval.    Time 4    Period Weeks    Status New    Target Date 01/25/21               PT Long Term Goals - 12/28/20 1602       PT LONG TERM GOAL #1   Title Pt will achieve WNL R shoulder AAROM in all planes in order to get dressed without limitation.    Baseline Unable to assess at eval due to shoulder restrictions.    Time 8    Period Weeks    Status New    Target Date 02/22/21      PT LONG TERM GOAL #2   Title Pt will achieve 4+/5 parascapular strength BIL in  all planes in order to promote WNL long-term scapular mechanics.    Baseline Unable to assess due to shoulder restrictions.    Time 8    Period Weeks    Status New    Target Date 02/22/21      PT LONG TERM GOAL #3   Title Pt will demonstrate ability to perform rotator cuff isometric strengthening without increase in pain in order to progress to phase III of his rehab protocol.    Baseline Unable to perform at eval due to shoulder restrictions.    Time 8    Period Weeks    Status New    Target Date 02/22/21      PT LONG TERM GOAL #4   Title Pt will demonstrate 5/5 R elbow flexion and extension MMT in order to progress his independent UE strengthening regimen.    Baseline Unable to assess due to surgical restrictions.    Time 8    Period Weeks    Status New    Target Date 02/22/21                   Plan - 01/16/21 1214     Clinical Impression Statement Patient arrives without reports of shoulder pain. Continued to follow post-op protocol focusing on Rt shoulder PROM within current ROM restrictions. He is able to easily achieve flexion and abduction within his current ROM restrictions without reports of pain. With ER tightness is felt at end range of current ROM restriction, though no reports of pain. Will continue to f/u post-op protocol maintaining current PROM restrictions until 6 weeks post-op .    PT Treatment/Interventions ADLs/Self Care Home Management;Cryotherapy;Moist Heat;Neuromuscular re-education;Therapeutic exercise;Therapeutic activities;Taping;Vasopneumatic Device;Patient/family education;Scar mobilization;Passive range of motion;Manual techniques    PT Next Visit Plan Promote R shoulder PROM, scapular mechanics, grip strength, follow Dr. Denton Brick Varkey's RTC repair protocol (PROM only first  6 weeks- flexion 140, ER 40 (from neutral) and abduction 60    PT Home Exercise Plan [facility]             Patient will benefit from skilled therapeutic intervention in  order to improve the following deficits and impairments:  Decreased range of motion, Impaired UE functional use, Pain, Hypomobility, Impaired flexibility, Decreased strength  Visit Diagnosis: Chronic right shoulder pain  Muscle weakness (generalized)  Stiffness of right shoulder, not elsewhere classified     Problem List Patient Active Problem List   Diagnosis Date Noted   GERD (gastroesophageal reflux disease) 01/10/2018   Palpitations 01/06/2013   Hyperlipidemia 01/06/2013   Abnormal liver function tests 01/06/2013   Low back pain 11/13/2011   Myofascial muscle pain 11/13/2011   Letitia Libra, PT, DPT, ATC 01/16/21 12:20 PM  [facility] Outpatient Rehabilitation [facility] [address] [place], [place], [address] Phone: [phone]   Fax:  [phone]  Name: Wayne Roberts MRN: [id number] Date of Birth: [birth year]

## 2021-01-18 ENCOUNTER — Ambulatory Visit: Payer: Medicaid Other | Admitting: Physical Therapy

## 2021-01-24 ENCOUNTER — Other Ambulatory Visit: Payer: Self-pay

## 2021-01-24 ENCOUNTER — Ambulatory Visit: Payer: Medicaid Other | Attending: Orthopaedic Surgery | Admitting: Physical Therapy

## 2021-01-24 DIAGNOSIS — G8929 Other chronic pain: Secondary | ICD-10-CM | POA: Diagnosis present

## 2021-01-24 DIAGNOSIS — M25611 Stiffness of right shoulder, not elsewhere classified: Secondary | ICD-10-CM | POA: Insufficient documentation

## 2021-01-24 DIAGNOSIS — M25511 Pain in right shoulder: Secondary | ICD-10-CM | POA: Diagnosis present

## 2021-01-24 DIAGNOSIS — M6281 Muscle weakness (generalized): Secondary | ICD-10-CM | POA: Insufficient documentation

## 2021-01-24 NOTE — Therapy (Signed)
[facility] Outpatient Rehabilitation [facility] [address] [place], [place], [address] Phone: [phone]   Fax:  [phone]  Physical Therapy Treatment  Patient Details  Name: Wayne Roberts MRN: [id number] Date of Birth: 1970-05-05 Referring Provider (PT): Bjorn Pippin, MD   Encounter Date: 01/24/2021   PT End of Session - 01/24/21 0939     Visit Number 7    Number of Visits 17    Date for PT Re-Evaluation 02/22/21    Authorization Type [employer]    Authorization Time Period Pending Authorization-    PT Start Time 0935    PT Stop Time 1005    PT Time Calculation (min) 30 min             Past Medical History:  Diagnosis Date   Acid reflux    Arthritis    Chronic back pain    Diabetes mellitus without complication (HCC)    states no meds now   Herniated disc    Hypercholesteremia    Hypertension     Past Surgical History:  Procedure Laterality Date   FOOT SURGERY Right    SHOULDER ARTHROSCOPY WITH DISTAL CLAVICLE RESECTION Right 12/22/2020   Procedure: SHOULDER ARTHROSCOPY WITH DISTAL CLAVICLE RESECTION;  Surgeon: Bjorn Pippin, MD;  Location: [facility];  Service: Orthopedics;  Laterality: Right;   SHOULDER ARTHROSCOPY WITH SUBACROMIAL DECOMPRESSION, ROTATOR CUFF REPAIR AND BICEP TENDON REPAIR Right 12/22/2020   Procedure: SHOULDER ARTHROSCOPY WITH SUBACROMIAL DECOMPRESSION, ROTATOR CUFF REPAIR AND BICEP TENODESIS;  Surgeon: Bjorn Pippin, MD;  Location: [facility];  Service: Orthopedics;  Laterality: Right;    There were no vitals filed for this visit.   Subjective Assessment - 01/24/21 0937     Subjective Shoulder is a little achey on the top, otherwise no issues.    Currently in Pain? Yes    Pain Score 2     Pain Location Shoulder    Pain Orientation Right    Pain Descriptors / Indicators Aching    Pain Type Surgical pain    Aggravating Factors  just has an ache    Pain Relieving Factors rest, pain  meds                [facility] PT Assessment - 01/24/21 0001       PROM   Right Shoulder Flexion 140 Degrees    Right Shoulder ABduction 60 Degrees    Right Shoulder External Rotation 30 Degrees   from neutral                          [facility] Adult PT Treatment/Exercise - 01/24/21 0001       Shoulder Exercises: Seated   Other Seated Exercises bilateral scap retraction x 15    Other Seated Exercises passive shoulder flexion arms on mat table seated in stool - rolling back - cautioned to remain below 140 degrees., elbow flexion and extension      Shoulder Exercises: Standing   Other Standing Exercises pendulums 3x30 CW and CCW on R      Manual Therapy   Passive ROM R shoulder passive flexion to 140, abduction60 (limited protocol) ER 40(limited by protocol), passive shoulder IR to tolerance, passive elbow extension,                     PT Education - 01/24/21 0934     Education Details HEP, FOTO handout and predictions  reviewed with patient.              PT Short Term Goals - 12/28/20 1559       PT SHORT TERM GOAL #1   Title Pt will achieve WNL R shoulder PROM in all planes in order to progress to phase II of his rehab protocol.    Baseline See flowsheet    Time 4    Period Weeks    Status New    Target Date 01/25/21      PT SHORT TERM GOAL #2   Title Pt will report understanding and adherence to his HEP in order to promote independence in the management of his primary impairments.    Baseline HEP provided at eval.    Time 4    Period Weeks    Status New    Target Date 01/25/21               PT Long Term Goals - 12/28/20 1602       PT LONG TERM GOAL #1   Title Pt will achieve WNL R shoulder AAROM in all planes in order to get dressed without limitation.    Baseline Unable to assess at eval due to shoulder restrictions.    Time 8    Period Weeks    Status New    Target Date 02/22/21      PT LONG TERM GOAL #2   Title Pt  will achieve 4+/5 parascapular strength BIL in all planes in order to promote WNL long-term scapular mechanics.    Baseline Unable to assess due to shoulder restrictions.    Time 8    Period Weeks    Status New    Target Date 02/22/21      PT LONG TERM GOAL #3   Title Pt will demonstrate ability to perform rotator cuff isometric strengthening without increase in pain in order to progress to phase III of his rehab protocol.    Baseline Unable to perform at eval due to shoulder restrictions.    Time 8    Period Weeks    Status New    Target Date 02/22/21      PT LONG TERM GOAL #4   Title Pt will demonstrate 5/5 R elbow flexion and extension MMT in order to progress his independent UE strengthening regimen.    Baseline Unable to assess due to surgical restrictions.    Time 8    Period Weeks    Status New    Target Date 02/22/21                   Plan - 01/24/21 1008     Clinical Impression Statement Pt is nearly 5 weeks post-op. Pt reports mild achiness in superior shoulder otherwise he has no pain and no difficulty sleeping. He canceled last week due to having the flu virus. Able to recover PROM to protocol limits with mobs and PROM. He has some increased pain at end range today due to time since his last appointment. He has been consistent with table PROM, pendulums and gripping. He notes decreased "pulling" when performing pendulums. Will continue PROM until 6 weeks post op.    PT Treatment/Interventions ADLs/Self Care Home Management;Cryotherapy;Moist Heat;Neuromuscular re-education;Therapeutic exercise;Therapeutic activities;Taping;Vasopneumatic Device;Patient/family education;Scar mobilization;Passive range of motion;Manual techniques    PT Next Visit Plan Promote R shoulder PROM, scapular mechanics, grip strength, follow Dr. Denton Brick Varkey's RTC repair protocol (PROM only first  6 weeks- flexion 140, ER 40 (from  neutral) and abduction 60    PT Home Exercise Plan [facility]              Patient will benefit from skilled therapeutic intervention in order to improve the following deficits and impairments:  Decreased range of motion, Impaired UE functional use, Pain, Hypomobility, Impaired flexibility, Decreased strength  Visit Diagnosis: Chronic right shoulder pain  Muscle weakness (generalized)  Stiffness of right shoulder, not elsewhere classified     Problem List Patient Active Problem List   Diagnosis Date Noted   GERD (gastroesophageal reflux disease) 01/10/2018   Palpitations 01/06/2013   Hyperlipidemia 01/06/2013   Abnormal liver function tests 01/06/2013   Low back pain 11/13/2011   Myofascial muscle pain 11/13/2011    Sherrie Mustache, PTA 01/24/2021, 10:14 AM  [facility] [address] [place], [place], [address] Phone: [phone]   Fax:  [phone]  Name: Iven Earnhart MRN: [id number] Date of Birth: [birth year]

## 2021-01-26 ENCOUNTER — Encounter: Payer: Self-pay | Admitting: Physical Therapy

## 2021-01-26 ENCOUNTER — Other Ambulatory Visit: Payer: Self-pay

## 2021-01-26 ENCOUNTER — Ambulatory Visit: Payer: Medicaid Other | Admitting: Physical Therapy

## 2021-01-26 DIAGNOSIS — G8929 Other chronic pain: Secondary | ICD-10-CM

## 2021-01-26 DIAGNOSIS — M25611 Stiffness of right shoulder, not elsewhere classified: Secondary | ICD-10-CM

## 2021-01-26 DIAGNOSIS — M25511 Pain in right shoulder: Secondary | ICD-10-CM | POA: Diagnosis not present

## 2021-01-26 DIAGNOSIS — M6281 Muscle weakness (generalized): Secondary | ICD-10-CM

## 2021-01-26 NOTE — Therapy (Signed)
[facility] Outpatient Rehabilitation [facility] [address] [place], [place], [address] Phone: [phone]   Fax:  [phone]  Physical Therapy Treatment  Patient Details  Name: Wayne Roberts MRN: [id number] Date of Birth: 1971/02/15 Referring Provider (PT): Bjorn Pippin, MD   Encounter Date: 01/26/2021   PT End of Session - 01/26/21 0932     Visit Number 8    Number of Visits 17    Date for PT Re-Evaluation 02/22/21    Authorization Type [employer]    Authorization Time Period Pending Authorization-    PT Start Time 0930    PT Stop Time 0953    PT Time Calculation (min) 23 min             Past Medical History:  Diagnosis Date   Acid reflux    Arthritis    Chronic back pain    Diabetes mellitus without complication (HCC)    states no meds now   Herniated disc    Hypercholesteremia    Hypertension     Past Surgical History:  Procedure Laterality Date   FOOT SURGERY Right    SHOULDER ARTHROSCOPY WITH DISTAL CLAVICLE RESECTION Right 12/22/2020   Procedure: SHOULDER ARTHROSCOPY WITH DISTAL CLAVICLE RESECTION;  Surgeon: Bjorn Pippin, MD;  Location: [facility];  Service: Orthopedics;  Laterality: Right;   SHOULDER ARTHROSCOPY WITH SUBACROMIAL DECOMPRESSION, ROTATOR CUFF REPAIR AND BICEP TENDON REPAIR Right 12/22/2020   Procedure: SHOULDER ARTHROSCOPY WITH SUBACROMIAL DECOMPRESSION, ROTATOR CUFF REPAIR AND BICEP TENODESIS;  Surgeon: Bjorn Pippin, MD;  Location: [facility];  Service: Orthopedics;  Laterality: Right;    There were no vitals filed for this visit.   Subjective Assessment - 01/26/21 0931     Subjective Shoulder was a little sore after last visit. Not as much achiness today.    Currently in Pain? Yes    Pain Score 1     Pain Location Shoulder    Pain Orientation Right    Pain Descriptors / Indicators Aching                               [facility] Adult PT Treatment/Exercise -  01/26/21 0001       Shoulder Exercises: Seated   Other Seated Exercises bilateral scap retraction x 15    Other Seated Exercises passive shoulder flexion arms on mat table seated in stool - rolling back - cautioned to remain below 140 degrees      Shoulder Exercises: Standing   Other Standing Exercises pendulums 3x30 CW and CCW on R      Manual Therapy   Passive ROM R shoulder passive flexion to 140, abduction60 (limited protocol) ER 40(limited by protocol), passive shoulder IR to tolerance, passive elbow extension,                       PT Short Term Goals - 12/28/20 1559       PT SHORT TERM GOAL #1   Title Pt will achieve WNL R shoulder PROM in all planes in order to progress to phase II of his rehab protocol.    Baseline See flowsheet    Time 4    Period Weeks    Status New    Target Date 01/25/21      PT SHORT TERM GOAL #2   Title Pt will report understanding and adherence to his HEP in order  to promote independence in the management of his primary impairments.    Baseline HEP provided at eval.    Time 4    Period Weeks    Status New    Target Date 01/25/21               PT Long Term Goals - 12/28/20 1602       PT LONG TERM GOAL #1   Title Pt will achieve WNL R shoulder AAROM in all planes in order to get dressed without limitation.    Baseline Unable to assess at eval due to shoulder restrictions.    Time 8    Period Weeks    Status New    Target Date 02/22/21      PT LONG TERM GOAL #2   Title Pt will achieve 4+/5 parascapular strength BIL in all planes in order to promote WNL long-term scapular mechanics.    Baseline Unable to assess due to shoulder restrictions.    Time 8    Period Weeks    Status New    Target Date 02/22/21      PT LONG TERM GOAL #3   Title Pt will demonstrate ability to perform rotator cuff isometric strengthening without increase in pain in order to progress to phase III of his rehab protocol.    Baseline Unable to  perform at eval due to shoulder restrictions.    Time 8    Period Weeks    Status New    Target Date 02/22/21      PT LONG TERM GOAL #4   Title Pt will demonstrate 5/5 R elbow flexion and extension MMT in order to progress his independent UE strengthening regimen.    Baseline Unable to assess due to surgical restrictions.    Time 8    Period Weeks    Status New    Target Date 02/22/21                   Plan - 01/26/21 1005     Clinical Impression Statement Wayne Roberts reports soreness after last session. He rates his shoulder pain at 1/10 at start of session today. Continued with Pendulums , self PROM with table slides and manual PROM in all planes. He tolerated session well with end range tightness in flexion and ER. He will be a 6 weeks post op on next visit.    PT Treatment/Interventions ADLs/Self Care Home Management;Cryotherapy;Moist Heat;Neuromuscular re-education;Therapeutic exercise;Therapeutic activities;Taping;Vasopneumatic Device;Patient/family education;Scar mobilization;Passive range of motion;Manual techniques    PT Next Visit Plan Begin 6 week protocol;  6 week  protocol: [email or web address]   (begin supine AAROM and isometrics, continue PROM)    PT Home Exercise Plan [facility]    Consulted and Agree with Plan of Care Patient             Patient will benefit from skilled therapeutic intervention in order to improve the following deficits and impairments:  Decreased range of motion, Impaired UE functional use, Pain, Hypomobility, Impaired flexibility, Decreased strength  Visit Diagnosis: Chronic right shoulder pain  Muscle weakness (generalized)  Stiffness of right shoulder, not elsewhere classified     Problem List Patient Active Problem List   Diagnosis Date Noted   GERD (gastroesophageal reflux disease) 01/10/2018   Palpitations 01/06/2013   Hyperlipidemia 01/06/2013   Abnormal liver function  tests 01/06/2013   Low back pain 11/13/2011   Myofascial muscle pain 11/13/2011    Sherrie Mustache, PTA 01/26/2021, 10:12 AM  [facility] [address] [place], [place], [address] Phone: [phone]   Fax:  [phone]  Name: Wayne Roberts MRN: [id number] Date of Birth: Dec 22, 1970

## 2021-02-03 ENCOUNTER — Ambulatory Visit: Payer: Medicaid Other | Admitting: Physical Therapy

## 2021-02-03 ENCOUNTER — Other Ambulatory Visit: Payer: Self-pay

## 2021-02-03 ENCOUNTER — Encounter: Payer: Self-pay | Admitting: Physical Therapy

## 2021-02-03 DIAGNOSIS — M25511 Pain in right shoulder: Secondary | ICD-10-CM | POA: Diagnosis not present

## 2021-02-03 DIAGNOSIS — G8929 Other chronic pain: Secondary | ICD-10-CM

## 2021-02-03 DIAGNOSIS — M6281 Muscle weakness (generalized): Secondary | ICD-10-CM

## 2021-02-03 DIAGNOSIS — M25611 Stiffness of right shoulder, not elsewhere classified: Secondary | ICD-10-CM

## 2021-02-03 NOTE — Therapy (Signed)
[place], [place], [address] Phone: [phone]   Fax:  [phone]  Physical Therapy Treatment  Patient Details  Name: Wayne Roberts MRN: [id number] Date of Birth: 02-02-1971 Referring Provider (PT): Hiram Gash, MD   Encounter Date: 02/03/2021   PT End of Session - 02/03/21 0810     Visit Number 9    Number of Visits 17    Date for PT Re-Evaluation 02/22/21    Authorization Type [employer]    Authorization Time Period 01/11/21-03/04/21    Authorization - Visit Number 5    Authorization - Number of Visits 16    PT Start Time 0805    PT Stop Time 0845    PT Time Calculation (min) 40 min             Past Medical History:  Diagnosis Date   Acid reflux    Arthritis    Chronic back pain    Diabetes mellitus without complication ([place])    states no meds now   Herniated disc    Hypercholesteremia    Hypertension     Past Surgical History:  Procedure Laterality Date   FOOT SURGERY Right    SHOULDER ARTHROSCOPY WITH DISTAL CLAVICLE RESECTION Right 12/22/2020   Procedure: SHOULDER ARTHROSCOPY WITH DISTAL CLAVICLE RESECTION;  Surgeon: Hiram Gash, MD;  Location: [place];  Service: Orthopedics;  Laterality: Right;   SHOULDER ARTHROSCOPY WITH SUBACROMIAL DECOMPRESSION, ROTATOR CUFF REPAIR AND BICEP TENDON REPAIR Right 12/22/2020   Procedure: SHOULDER ARTHROSCOPY WITH SUBACROMIAL DECOMPRESSION, ROTATOR CUFF REPAIR AND BICEP TENODESIS;  Surgeon: Hiram Gash, MD;  Location: [place];  Service: Orthopedics;  Laterality: Right;    There were no vitals filed for this visit.   Subjective Assessment - 02/03/21 0809     Subjective No pain. I am going to the surgeon today for follow up.    Currently in Pain? No/denies                [facility] PT Assessment - 02/03/21 0001       PROM   Right Shoulder Flexion 136 Degrees    Right Shoulder ABduction 95  Degrees    Right Shoulder Internal Rotation 55 Degrees    Right Shoulder External Rotation 45 Degrees   20 degrees abduction                          [facility] Adult PT Treatment/Exercise - 02/03/21 0001       Shoulder Exercises: Supine   Other Supine Exercises Supine dowel ER/IR AAROM x 10 with 5 sec hold; chest press x 10 then pullovers from lap to overhead stretch 5 sec x 10      Shoulder Exercises: Standing   Other Standing Exercises scap retraction 10 x 2    Other Standing Exercises pendulums 3x30 CW and CCW on R      Shoulder Exercises: Isometric Strengthening   External Rotation 5X5"    External Rotation Limitations 2 sets, towel at doorway    Internal Rotation 5X5"    Internal Rotation Limitations 2 sets, towel at doorway      Manual Therapy   Passive ROM R shoulder mobs and PROM to tolerance flexion, abduction, ER,IR                     PT Education - 02/03/21 0840     Education  Details HEP    Person(s) Educated Patient    Methods Explanation;Handout    Comprehension Verbalized understanding              PT Short Term Goals - 02/03/21 0851       PT SHORT TERM GOAL #1   Title Pt will achieve WNL R shoulder PROM in all planes in order to progress to phase II of his rehab protocol.    Baseline 02/03/21-6weeks post op- able to progress past initial PROM restrictions of 140 flex, 60 abduct, and 40 ER.    Time 4    Period Weeks    Status On-going      PT SHORT TERM GOAL #2   Title Pt will report understanding and adherence to his HEP in order to promote independence in the management of his primary impairments.    Baseline HEP provided at eval; 02/03/21- independent with initial HEP    Time 4    Period Weeks    Status Achieved    Target Date 01/25/21               PT Long Term Goals - 12/28/20 1602       PT LONG TERM GOAL #1   Title Pt will achieve WNL R shoulder AAROM in all planes in order to get dressed without  limitation.    Baseline Unable to assess at eval due to shoulder restrictions.    Time 8    Period Weeks    Status New    Target Date 02/22/21      PT LONG TERM GOAL #2   Title Pt will achieve 4+/5 parascapular strength BIL in all planes in order to promote WNL long-term scapular mechanics.    Baseline Unable to assess due to shoulder restrictions.    Time 8    Period Weeks    Status New    Target Date 02/22/21      PT LONG TERM GOAL #3   Title Pt will demonstrate ability to perform rotator cuff isometric strengthening without increase in pain in order to progress to phase III of his rehab protocol.    Baseline Unable to perform at eval due to shoulder restrictions.    Time 8    Period Weeks    Status New    Target Date 02/22/21      PT LONG TERM GOAL #4   Title Pt will demonstrate 5/5 R elbow flexion and extension MMT in order to progress his independent UE strengthening regimen.    Baseline Unable to assess due to surgical restrictions.    Time 8    Period Weeks    Status New    Target Date 02/22/21                   Plan - 02/03/21 0857     Clinical Impression Statement Wayne Roberts reports no pain on arrival and continued adherance to HEP, STG#2 met. Began with PROM and achieved 136 flexion, 45 ER , 95 abduction and 55 IR (slightly abducted for  IR/ER measurements). He is 6 weeks Post op and will have follow up with surgeon later today. Per protocol able to progress with gentle end range stretches in all planes and began supine AAROM and RTC isometrics. Pt tolerated progressions well without c/o increased pain. Declined ice at end of session, will ice at home. He will be out of towen next week so encouraged compliance with HEP until he resumes PT.  PT Treatment/Interventions ADLs/Self Care Home Management;Cryotherapy;Moist Heat;Neuromuscular re-education;Therapeutic exercise;Therapeutic activities;Taping;Vasopneumatic Device;Patient/family education;Scar  mobilization;Passive range of motion;Manual techniques    PT Next Visit Plan Begin 6 week protocol;  6 week  protocol: [email or web address]_[email or web address]   (review supine AAROM and isometrics, continue PROM as tolerated)    PT Home Exercise Plan [facility]             Patient will benefit from skilled therapeutic intervention in order to improve the following deficits and impairments:  Decreased range of motion, Impaired UE functional use, Pain, Hypomobility, Impaired flexibility, Decreased strength  Visit Diagnosis: Chronic right shoulder pain  Muscle weakness (generalized)  Stiffness of right shoulder, not elsewhere classified     Problem List Patient Active Problem List   Diagnosis Date Noted   GERD (gastroesophageal reflux disease) 01/10/2018   Palpitations 01/06/2013   Hyperlipidemia 01/06/2013   Abnormal liver function tests 01/06/2013   Low back pain 11/13/2011   Myofascial muscle pain 11/13/2011    Dorene Ar, PTA 02/03/2021, 9:03 AM  [facility] [address] [place], [place], [address] Phone: [phone]   Fax:  [phone]  Name: Wayne Roberts MRN: [id number] Date of Birth: 1970-06-16

## 2021-02-03 NOTE — Patient Instructions (Signed)
Access Code: [email or web address] URL: [email or web address] Date: 02/03/2021 Prepared by: Jannette Spanner  Exercises Circular Shoulder Pendulum with Table Support - 1 x daily - 7 x weekly - 3 sets - 20 reps Supine Shoulder Flexion PROM - 1 x daily - 7 x weekly - 3 sets - 10 reps - 5sec hold Putty Squeezes - 1 x daily - 7 x weekly - 3 sets - hold Supine Shoulder External Rotation with Dowel - 1 x daily - 7 x weekly - 2 sets - 10 reps - 5 hold Supine Shoulder Flexion with Dowel - 1 x daily - 7 x weekly - 2 sets - 10 reps Standing Isometric Shoulder External Rotation with Doorway - 1 x daily - 7 x weekly - 2 sets - 10 reps - 5 hold Standing Isometric Shoulder Internal Rotation at Doorway - 1 x daily - 7 x weekly - 3 sets - 10 reps - 5 hold

## 2021-02-14 ENCOUNTER — Encounter: Payer: Self-pay | Admitting: Physical Therapy

## 2021-02-14 ENCOUNTER — Other Ambulatory Visit: Payer: Self-pay

## 2021-02-14 ENCOUNTER — Ambulatory Visit: Payer: Medicaid Other | Admitting: Physical Therapy

## 2021-02-14 DIAGNOSIS — M25511 Pain in right shoulder: Secondary | ICD-10-CM

## 2021-02-14 DIAGNOSIS — G8929 Other chronic pain: Secondary | ICD-10-CM

## 2021-02-14 DIAGNOSIS — M25611 Stiffness of right shoulder, not elsewhere classified: Secondary | ICD-10-CM

## 2021-02-14 DIAGNOSIS — M6281 Muscle weakness (generalized): Secondary | ICD-10-CM

## 2021-02-14 NOTE — Therapy (Signed)
[facility] [address] [place], [place], [address] Phone: [phone]   Fax:  [phone]  Physical Therapy Treatment  Patient Details  Name: Wayne Roberts MRN: [id number] Date of Birth: 1970-11-02 Referring Provider (PT): Bjorn Pippin, MD   Encounter Date: 02/14/2021   PT End of Session - 02/14/21 0941     Visit Number 10    Number of Visits 17    Date for PT Re-Evaluation 02/22/21    Authorization Type [employer]    Authorization Time Period 01/11/21-03/04/21    Authorization - Visit Number 6    Authorization - Number of Visits 16    PT Start Time 0935    PT Stop Time 1008    PT Time Calculation (min) 33 min             Past Medical History:  Diagnosis Date   Acid reflux    Arthritis    Chronic back pain    Diabetes mellitus without complication ([facility])    states no meds now   Herniated disc    Hypercholesteremia    Hypertension     Past Surgical History:  Procedure Laterality Date   FOOT SURGERY Right    SHOULDER ARTHROSCOPY WITH DISTAL CLAVICLE RESECTION Right 12/22/2020   Procedure: SHOULDER ARTHROSCOPY WITH DISTAL CLAVICLE RESECTION;  Surgeon: Bjorn Pippin, MD;  Location: [facility];  Service: Orthopedics;  Laterality: Right;   SHOULDER ARTHROSCOPY WITH SUBACROMIAL DECOMPRESSION, ROTATOR CUFF REPAIR AND BICEP TENDON REPAIR Right 12/22/2020   Procedure: SHOULDER ARTHROSCOPY WITH SUBACROMIAL DECOMPRESSION, ROTATOR CUFF REPAIR AND BICEP TENODESIS;  Surgeon: Bjorn Pippin, MD;  Location: [facility];  Service: Orthopedics;  Laterality: Right;    There were no vitals filed for this visit.   Subjective Assessment - 02/14/21 0937     Subjective No pain, using the arm some. Did the exercises some on vacation, not as much as I should.    Currently in Pain? No/denies                [facility] PT Assessment - 02/14/21 0001       AROM   Right Shoulder Flexion 135 Degrees     Right Shoulder ABduction 135 Degrees    Right Shoulder Internal Rotation --   reach L4   Right Shoulder External Rotation --   Reach to t2                          [facility] Adult PT Treatment/Exercise - 02/14/21 0001       Shoulder Exercises: Supine   Other Supine Exercises Supine dowel ER/IR AAROM x 10 with 5 sec hold; chest press x 10 then pullovers from lap to overhead stretch 5 sec x 10      Shoulder Exercises: Seated   Other Seated Exercises bilateral scap retraction x 15      Shoulder Exercises: Pulleys   Flexion 2 minutes    ABduction 1 minute      Shoulder Exercises: Isometric Strengthening   External Rotation 5X5"    External Rotation Limitations 2 sets, towel at doorway    Internal Rotation 5X5"    Internal Rotation Limitations 2 sets, towel at doorway      Manual Therapy   Passive ROM R shoulder mobs and PROM to tolerance flexion, abduction, ER,IR  PT Short Term Goals - 02/03/21 0851       PT SHORT TERM GOAL #1   Title Pt will achieve WNL R shoulder PROM in all planes in order to progress to phase II of his rehab protocol.    Baseline 02/03/21-6weeks post op- able to progress past initial PROM restrictions of 140 flex, 60 abduct, and 40 ER.    Time 4    Period Weeks    Status On-going      PT SHORT TERM GOAL #2   Title Pt will report understanding and adherence to his HEP in order to promote independence in the management of his primary impairments.    Baseline HEP provided at eval; 02/03/21- independent with initial HEP    Time 4    Period Weeks    Status Achieved    Target Date 01/25/21               PT Long Term Goals - 12/28/20 1602       PT LONG TERM GOAL #1   Title Pt will achieve WNL R shoulder AAROM in all planes in order to get dressed without limitation.    Baseline Unable to assess at eval due to shoulder restrictions.    Time 8    Period Weeks    Status New    Target Date 02/22/21       PT LONG TERM GOAL #2   Title Pt will achieve 4+/5 parascapular strength BIL in all planes in order to promote WNL long-term scapular mechanics.    Baseline Unable to assess due to shoulder restrictions.    Time 8    Period Weeks    Status New    Target Date 02/22/21      PT LONG TERM GOAL #3   Title Pt will demonstrate ability to perform rotator cuff isometric strengthening without increase in pain in order to progress to phase III of his rehab protocol.    Baseline Unable to perform at eval due to shoulder restrictions.    Time 8    Period Weeks    Status New    Target Date 02/22/21      PT LONG TERM GOAL #4   Title Pt will demonstrate 5/5 R elbow flexion and extension MMT in order to progress his independent UE strengthening regimen.    Baseline Unable to assess due to surgical restrictions.    Time 8    Period Weeks    Status New    Target Date 02/22/21                   Plan - 02/14/21 1006     Clinical Impression Statement Wayne Roberts is 7.5 weeks post op RTC surgery. He was on vacation last week and reports min compliance with HEP. His AROM has improved. Continued with gentle isometrics and AAROM, able to begin pulley with good tolerance.    PT Treatment/Interventions ADLs/Self Care Home Management;Cryotherapy;Moist Heat;Neuromuscular re-education;Therapeutic exercise;Therapeutic activities;Taping;Vasopneumatic Device;Patient/family education;Scar mobilization;Passive range of motion;Manual techniques    PT Next Visit Plan Begin 6 week protocol;  6 week  protocol: [email or web address]   (review supine AAROM and isometrics, continue PROM as tolerated)    PT Home Exercise Plan [facility]             Patient will benefit from skilled therapeutic intervention in order to improve the following deficits and impairments:  Decreased range of motion, Impaired UE functional use, Pain, Hypomobility, Impaired flexibility,  Decreased strength  Visit Diagnosis: Chronic right shoulder pain  Muscle weakness (generalized)  Stiffness of right shoulder, not elsewhere classified     Problem List Patient Active Problem List   Diagnosis Date Noted   GERD (gastroesophageal reflux disease) 01/10/2018   Palpitations 01/06/2013   Hyperlipidemia 01/06/2013   Abnormal liver function tests 01/06/2013   Low back pain 11/13/2011   Myofascial muscle pain 11/13/2011    Sherrie Mustache, PTA 02/14/2021, 10:13 AM  [facility] [address] [place], [place], [address] Phone: [phone]   Fax:  [phone]  Name: Wayne Roberts MRN: [id number] Date of Birth: 05/19/1970

## 2021-02-16 ENCOUNTER — Ambulatory Visit: Payer: Medicaid Other | Attending: Orthopaedic Surgery

## 2021-02-16 ENCOUNTER — Other Ambulatory Visit: Payer: Self-pay

## 2021-02-16 DIAGNOSIS — M25511 Pain in right shoulder: Secondary | ICD-10-CM | POA: Diagnosis present

## 2021-02-16 DIAGNOSIS — G8929 Other chronic pain: Secondary | ICD-10-CM

## 2021-02-16 DIAGNOSIS — M6281 Muscle weakness (generalized): Secondary | ICD-10-CM | POA: Diagnosis present

## 2021-02-16 DIAGNOSIS — M25611 Stiffness of right shoulder, not elsewhere classified: Secondary | ICD-10-CM

## 2021-02-16 NOTE — Therapy (Addendum)
[facility] [address] [place], [place], [address] Phone: [phone]   Fax:  [phone]  Physical Therapy Treatment  Patient Details  Name: Wayne Roberts MRN: [id number] Date of Birth: 04/11/1970 Referring Provider (PT): Bjorn Pippin, MD   Encounter Date: 02/16/2021   PT End of Session - 02/16/21 1248     Visit Number 11    Number of Visits 17    Date for PT Re-Evaluation 02/22/21    Authorization Type [employer]    Authorization Time Period 01/11/21-03/04/21    Authorization - Visit Number 7    Authorization - Number of Visits 16    PT Start Time 1230    PT Stop Time 1315    PT Time Calculation (min) 45 min    Activity Tolerance Patient tolerated treatment well    Behavior During Therapy WFL for tasks assessed/performed             Past Medical History:  Diagnosis Date   Acid reflux    Arthritis    Chronic back pain    Diabetes mellitus without complication ([facility])    states no meds now   Herniated disc    Hypercholesteremia    Hypertension     Past Surgical History:  Procedure Laterality Date   FOOT SURGERY Right    SHOULDER ARTHROSCOPY WITH DISTAL CLAVICLE RESECTION Right 12/22/2020   Procedure: SHOULDER ARTHROSCOPY WITH DISTAL CLAVICLE RESECTION;  Surgeon: Bjorn Pippin, MD;  Location: [facility];  Service: Orthopedics;  Laterality: Right;   SHOULDER ARTHROSCOPY WITH SUBACROMIAL DECOMPRESSION, ROTATOR CUFF REPAIR AND BICEP TENDON REPAIR Right 12/22/2020   Procedure: SHOULDER ARTHROSCOPY WITH SUBACROMIAL DECOMPRESSION, ROTATOR CUFF REPAIR AND BICEP TENODESIS;  Surgeon: Bjorn Pippin, MD;  [facility];  Service: Orthopedics;  Laterality: Right;    There were no vitals filed for this visit.   Subjective Assessment - 02/16/21 1233     Subjective Pt says that he doesn't have any pain today and that his HEP is still challenging.    Currently in Pain? No/denies                 [facility] PT Assessment - 02/16/21 0001       AROM   Right Shoulder Flexion --    Right Shoulder ABduction --      PROM   Left Shoulder Flexion 133 Degrees    Left Shoulder ABduction 135 Degrees   pain at end range                          [facility] Adult PT Treatment/Exercise - 02/16/21 0001       Shoulder Exercises: Supine   Other Supine Exercises Supine dowel ER/IR AAROM x 10 with 5 sec hold; chest press x 10 then pullovers from lap to overhead stretch 5 sec x 10      Shoulder Exercises: Standing   Other Standing Exercises Abd, IR/ER, flexion isometrics, extensnion 10secx10    Other Standing Exercises Wall slides flexion and abduction 1x15 each      Manual Therapy   Passive ROM PROM to tolerance flexion, abduction, ER,IR, horiz abd, horiz add                       PT Short Term Goals - 02/03/21 0851       PT SHORT TERM GOAL #1   Title Pt will achieve  WNL R shoulder PROM in all planes in order to progress to phase II of his rehab protocol.    Baseline 02/03/21-6weeks post op- able to progress past initial PROM restrictions of 140 flex, 60 abduct, and 40 ER.    Time 4    Period Weeks    Status On-going      PT SHORT TERM GOAL #2   Title Pt will report understanding and adherence to his HEP in order to promote independence in the management of his primary impairments.    Baseline HEP provided at eval; 02/03/21- independent with initial HEP    Time 4    Period Weeks    Status Achieved    Target Date 01/25/21               PT Long Term Goals - 12/28/20 1602       PT LONG TERM GOAL #1   Title Pt will achieve WNL R shoulder AAROM in all planes in order to get dressed without limitation.    Baseline Unable to assess at eval due to shoulder restrictions.    Time 8    Period Weeks    Status New    Target Date 02/22/21      PT LONG TERM GOAL #2   Title Pt will achieve 4+/5 parascapular strength BIL in all planes in  order to promote WNL long-term scapular mechanics.    Baseline Unable to assess due to shoulder restrictions.    Time 8    Period Weeks    Status New    Target Date 02/22/21      PT LONG TERM GOAL #3   Title Pt will demonstrate ability to perform rotator cuff isometric strengthening without increase in pain in order to progress to phase III of his rehab protocol.    Baseline Unable to perform at eval due to shoulder restrictions.    Time 8    Period Weeks    Status New    Target Date 02/22/21      PT LONG TERM GOAL #4   Title Pt will demonstrate 5/5 R elbow flexion and extension MMT in order to progress his independent UE strengthening regimen.    Baseline Unable to assess due to surgical restrictions.    Time 8    Period Weeks    Status New    Target Date 02/22/21                   Plan - 02/16/21 1249     Clinical Impression Statement Pt is 8 weeks post op RTC surgery. Todays session focused on AAROM in supine and progression into isometrics of deltoid and periscapular muscles. Pt tolerated todays session well with no increased pain. Able to achieve improvements in shoulder flexion and abduction PROM with minimal reports of pain at end range.    PT Treatment/Interventions ADLs/Self Care Home Management;Cryotherapy;Moist Heat;Neuromuscular re-education;Therapeutic exercise;Therapeutic activities;Taping;Vasopneumatic Device;Patient/family education;Scar mobilization;Passive range of motion;Manual techniques    PT Next Visit Plan Begin 6 week protocol;  8 week  protocol: [email or web address]   (review supine AAROM and isometrics, continue PROM as tolerated)    PT Home Exercise Plan [facility]             Patient will benefit from skilled therapeutic intervention in order to improve the following deficits and impairments:  Decreased range of motion, Impaired UE functional use, Pain, Hypomobility, Impaired flexibility,  Decreased strength  Visit Diagnosis: No diagnosis found.     Problem  List Patient Active Problem List   Diagnosis Date Noted   GERD (gastroesophageal reflux disease) 01/10/2018   Palpitations 01/06/2013   Hyperlipidemia 01/06/2013   Abnormal liver function tests 01/06/2013   Low back pain 11/13/2011   Myofascial muscle pain 11/13/2011    Velna Ochs, SPT 02/16/21 2:26 PM   [facility] [address] [place], [place], [address] Phone: [phone]   Fax:  [phone]  Name: Wayne Roberts MRN: [id number] Date of Birth: 1970/03/20

## 2021-02-21 ENCOUNTER — Other Ambulatory Visit: Payer: Self-pay

## 2021-02-21 ENCOUNTER — Encounter: Payer: Self-pay | Admitting: Physical Therapy

## 2021-02-21 ENCOUNTER — Ambulatory Visit: Payer: Medicaid Other | Admitting: Physical Therapy

## 2021-02-21 DIAGNOSIS — M25611 Stiffness of right shoulder, not elsewhere classified: Secondary | ICD-10-CM

## 2021-02-21 DIAGNOSIS — G8929 Other chronic pain: Secondary | ICD-10-CM

## 2021-02-21 DIAGNOSIS — M25511 Pain in right shoulder: Secondary | ICD-10-CM | POA: Diagnosis not present

## 2021-02-21 DIAGNOSIS — M6281 Muscle weakness (generalized): Secondary | ICD-10-CM

## 2021-02-21 NOTE — Therapy (Signed)
[facility] Outpatient Rehabilitation [facility] [address] [place], [place], [address] Phone: [phone]   Fax:  [phone]  Physical Therapy Treatment  Patient Details  Name: Wayne Roberts MRN: [id number] Date of Birth: 04/15/1970 Referring Provider (PT): Bjorn Pippin, MD   Encounter Date: 02/21/2021   PT End of Session - 02/21/21 0935     Visit Number 12    Number of Visits 17    Date for PT Re-Evaluation 02/22/21    Authorization Time Period 01/11/21-03/04/21    Authorization - Visit Number 8    Authorization - Number of Visits 16    PT Start Time 0930    PT Stop Time 1010    PT Time Calculation (min) 40 min             Past Medical History:  Diagnosis Date   Acid reflux    Arthritis    Chronic back pain    Diabetes mellitus without complication (HCC)    states no meds now   Herniated disc    Hypercholesteremia    Hypertension     Past Surgical History:  Procedure Laterality Date   FOOT SURGERY Right    SHOULDER ARTHROSCOPY WITH DISTAL CLAVICLE RESECTION Right 12/22/2020   Procedure: SHOULDER ARTHROSCOPY WITH DISTAL CLAVICLE RESECTION;  Surgeon: Bjorn Pippin, MD;  Location: [facility];  Service: Orthopedics;  Laterality: Right;   SHOULDER ARTHROSCOPY WITH SUBACROMIAL DECOMPRESSION, ROTATOR CUFF REPAIR AND BICEP TENDON REPAIR Right 12/22/2020   Procedure: SHOULDER ARTHROSCOPY WITH SUBACROMIAL DECOMPRESSION, ROTATOR CUFF REPAIR AND BICEP TENODESIS;  Surgeon: Bjorn Pippin, MD;  Location: [facility];  Service: Orthopedics;  Laterality: Right;    There were no vitals filed for this visit.   Subjective Assessment - 02/21/21 0934     Subjective The shoulder was sore so I rested it this weekend, I did not do the wall slides.    Currently in Pain? No/denies                [facility] PT Assessment - 02/21/21 0001       AROM   Right Shoulder Flexion 144 Degrees    Right Shoulder ABduction 135 Degrees    Right  Shoulder Internal Rotation --   lower lumbar reach   Right Shoulder External Rotation --   Reach to t2                          [facility] Adult PT Treatment/Exercise - 02/21/21 0001       Shoulder Exercises: Standing   Other Standing Exercises Wall slides flexion and abduction 1x15 each      Shoulder Exercises: Pulleys   Flexion 2 minutes    ABduction 1 minute      Shoulder Exercises: Isometric Strengthening   Extension 5X5"    Extension Limitations 2 sets , towel at doorway    External Rotation 5X5"    External Rotation Limitations 2 sets, towel at doorway    Internal Rotation 5X5"    Internal Rotation Limitations 2 sets, towel at doorway    ABduction 5X5"    ABduction Limitations 2 sets , towel at doorway      Manual Therapy   Passive ROM PROM to tolerance flexion, abduction, ER,IR, horiz abd, horiz add                       PT Short Term Goals - 02/03/21 0932  PT SHORT TERM GOAL #1   Title Pt will achieve WNL R shoulder PROM in all planes in order to progress to phase II of his rehab protocol.    Baseline 02/03/21-6weeks post op- able to progress past initial PROM restrictions of 140 flex, 60 abduct, and 40 ER.    Time 4    Period Weeks    Status On-going      PT SHORT TERM GOAL #2   Title Pt will report understanding and adherence to his HEP in order to promote independence in the management of his primary impairments.    Baseline HEP provided at eval; 02/03/21- independent with initial HEP    Time 4    Period Weeks    Status Achieved    Target Date 01/25/21               PT Long Term Goals - 12/28/20 1602       PT LONG TERM GOAL #1   Title Pt will achieve WNL R shoulder AAROM in all planes in order to get dressed without limitation.    Baseline Unable to assess at eval due to shoulder restrictions.    Time 8    Period Weeks    Status New    Target Date 02/22/21      PT LONG TERM GOAL #2   Title Pt will achieve 4+/5  parascapular strength BIL in all planes in order to promote WNL long-term scapular mechanics.    Baseline Unable to assess due to shoulder restrictions.    Time 8    Period Weeks    Status New    Target Date 02/22/21      PT LONG TERM GOAL #3   Title Pt will demonstrate ability to perform rotator cuff isometric strengthening without increase in pain in order to progress to phase III of his rehab protocol.    Baseline Unable to perform at eval due to shoulder restrictions.    Time 8    Period Weeks    Status New    Target Date 02/22/21      PT LONG TERM GOAL #4   Title Pt will demonstrate 5/5 R elbow flexion and extension MMT in order to progress his independent UE strengthening regimen.    Baseline Unable to assess due to surgical restrictions.    Time 8    Period Weeks    Status New    Target Date 02/22/21                   Plan - 02/21/21 1000     Clinical Impression Statement Pt reports increased soreness and limited HEP to recover from increased soreness. Continued with previous therex from last session which progressed isometrics and to vertical AAROM. He tolerated session without c/o increased pain.    PT Treatment/Interventions ADLs/Self Care Home Management;Cryotherapy;Moist Heat;Neuromuscular re-education;Therapeutic exercise;Therapeutic activities;Taping;Vasopneumatic Device;Patient/family education;Scar mobilization;Passive range of motion;Manual techniques    PT Next Visit Plan Begin 6 week protocol;  8 week  protocol: [email or web address]_[email or web address]   (review supine AAROM and isometrics, continue PROM as tolerated)    PT Home Exercise Plan [facility]             Patient will benefit from skilled therapeutic intervention in order to improve the following deficits and impairments:  Decreased range of motion, Impaired UE functional use, Pain, Hypomobility, Impaired flexibility, Decreased strength  Visit  Diagnosis: Chronic right shoulder pain  Muscle weakness (generalized)  Stiffness of right shoulder, not elsewhere  classified     Problem List Patient Active Problem List   Diagnosis Date Noted   GERD (gastroesophageal reflux disease) 01/10/2018   Palpitations 01/06/2013   Hyperlipidemia 01/06/2013   Abnormal liver function tests 01/06/2013   Low back pain 11/13/2011   Myofascial muscle pain 11/13/2011    Sherrie Mustache, PTA 02/21/2021, 10:04 AM  [facility] [address] [place], [place], [address] Phone: [phone]   Fax:  [phone]  Name: Wayne Roberts MRN: [id number] Date of Birth: [birth year]

## 2021-02-23 ENCOUNTER — Other Ambulatory Visit: Payer: Self-pay

## 2021-02-23 ENCOUNTER — Ambulatory Visit: Payer: Medicaid Other | Admitting: Physical Therapy

## 2021-02-23 DIAGNOSIS — M25511 Pain in right shoulder: Secondary | ICD-10-CM

## 2021-02-23 DIAGNOSIS — G8929 Other chronic pain: Secondary | ICD-10-CM

## 2021-02-23 DIAGNOSIS — M6281 Muscle weakness (generalized): Secondary | ICD-10-CM

## 2021-02-23 DIAGNOSIS — M25611 Stiffness of right shoulder, not elsewhere classified: Secondary | ICD-10-CM

## 2021-02-23 NOTE — Therapy (Addendum)
[place], [place], [address] Phone: [phone]   Fax:  [phone]  Physical Therapy Treatment/Re-certification  Patient Details  Name: Wayne Roberts MRN: [id number] Date of Birth: 1970/03/21 Referring Provider (PT): Hiram Gash, MD   Encounter Date: 02/23/2021   PT End of Session - 02/23/21 0935     Visit Number 13    Number of Visits 25    Date for PT Re-Evaluation 04/08/21    Authorization Type [employer]    Authorization Time Period 01/11/21-03/04/21    Authorization - Visit Number 9    Authorization - Number of Visits 16    PT Start Time 0932    PT Stop Time 1015    PT Time Calculation (min) 43 min             Past Medical History:  Diagnosis Date   Acid reflux    Arthritis    Chronic back pain    Diabetes mellitus without complication ([place])    states no meds now   Herniated disc    Hypercholesteremia    Hypertension     Past Surgical History:  Procedure Laterality Date   FOOT SURGERY Right    SHOULDER ARTHROSCOPY WITH DISTAL CLAVICLE RESECTION Right 12/22/2020   Procedure: SHOULDER ARTHROSCOPY WITH DISTAL CLAVICLE RESECTION;  Surgeon: Hiram Gash, MD;  Location: [facility];  Service: Orthopedics;  Laterality: Right;   SHOULDER ARTHROSCOPY WITH SUBACROMIAL DECOMPRESSION, ROTATOR CUFF REPAIR AND BICEP TENDON REPAIR Right 12/22/2020   Procedure: SHOULDER ARTHROSCOPY WITH SUBACROMIAL DECOMPRESSION, ROTATOR CUFF REPAIR AND BICEP TENODESIS;  Surgeon: Hiram Gash, MD;  Location: [facility];  Service: Orthopedics;  Laterality: Right;    There were no vitals filed for this visit.       [facility] PT Assessment - 02/23/21 0001       AROM   Right Shoulder Flexion 148 Degrees   AAROM   Right Shoulder ABduction 145 Degrees   AAROM   Right Shoulder Internal Rotation --   lower lumbar reach, AAROM with pulleys   Right Shoulder External Rotation --    Reach to t2 , AROM   Left Shoulder Flexion 180 Degrees    Left Shoulder ABduction 180 Degrees    Left Shoulder Internal Rotation 65 Degrees    Left Shoulder External Rotation 90 Degrees      PROM   Right Shoulder Flexion 150 Degrees    Right Shoulder ABduction 150 Degrees    Right Shoulder Internal Rotation 65 Degrees    Right Shoulder External Rotation 75 Degrees                           [facility] Adult PT Treatment/Exercise - 02/23/21 0001       Shoulder Exercises: Supine   Flexion 10 reps;AROM      Shoulder Exercises: Sidelying   External Rotation 10 reps;AROM    ABduction 10 reps;AROM      Shoulder Exercises: Standing   Other Standing Exercises Wall slides flexion and abduction 1x15 each      Shoulder Exercises: Pulleys   Flexion 2 minutes    ABduction 1 minute    Other Pulley Exercises IR x 10, pulley      Shoulder Exercises: Isometric Strengthening   Extension 5X5"    Extension Limitations 2 sets , towel at doorway    External Rotation 5X5"    External Rotation Limitations  2 sets, towel at doorway    Internal Rotation 5X5"    Internal Rotation Limitations 2 sets, towel at doorway    ABduction 5X5"    ABduction Limitations 2 sets , towel at doorway      Manual Therapy   Passive ROM PROM to tolerance flexion, abduction, ER,IR, horiz abd, horiz add                       PT Short Term Goals - 02/23/21 0950       PT SHORT TERM GOAL #1   Title Pt will achieve WNL R shoulder PROM in all planes in order to progress to phase II of his rehab protocol.    Baseline 02/03/21-6weeks post op- able to progress past initial PROM restrictions of 140 flex, 60 abduct, and 40 ER; 02/23/21: not yet WNL- see objective measures    Time 4    Status On-going    Target Date 01/25/21      PT SHORT TERM GOAL #2   Title Pt will report understanding and adherence to his HEP in order to promote independence in the management of his primary impairments.     Baseline HEP provided at eval; 02/03/21- independent with initial HEP    Time 4    Period Weeks    Status Achieved    Target Date 01/25/21               PT Long Term Goals - 02/23/21 0951       PT LONG TERM GOAL #1   Title Pt will achieve WNL R shoulder AAROM in all planes in order to get dressed without limitation.    Baseline Unable to assess at eval due to shoulder restrictions.; 02/23/21: improving AAROM-see objective measures    Time 8    Period Weeks    Status On-going      PT LONG TERM GOAL #2   Title Pt will achieve 4+/5 parascapular strength BIL in all planes in order to promote WNL long-term scapular mechanics.    Baseline Unable to assess due to shoulder restrictions 02/23/21: unable to assess due to shoulder restriction    Time 8    Period Weeks    Status Unable to assess      PT LONG TERM GOAL #3   Title Pt will demonstrate ability to perform rotator cuff isometric strengthening without increase in pain in order to progress to phase III of his rehab protocol.    Baseline Unable to perform at eval due to shoulder restrictions; 02/23/21 : able to perfrom without increased pain    Time 8    Period Weeks    Status Achieved    Target Date 02/22/21      PT LONG TERM GOAL #4   Title Pt will demonstrate 5/5 R elbow flexion and extension MMT in order to progress his independent UE strengthening regimen.    Baseline Unable to assess due to surgical restrictions; 02/23/21: unable to assess    Time 8    Period Weeks    Status Unable to assess    Target Date 02/22/21                   Plan - 02/23/21 0957     Clinical Impression Statement Pt is 9 weeks S/P RTC repair and is progressing well within protocol limits. His PROM, is improving, not yet WNL compared to LUE. He is independent with his HEP this far.  His AAROM has improved. Continued with AAAROM, PROM and isometrics. Began AROM in supine with good tolerance. He has met LTG # 3.  He should be ready to  progress to light strengthening per protocol at week 10. He will benefit from continued skilled PT 2xweek for 6 weeks to continue to safely progress his Rt shoulder ROM and strength per his post-op protocol in order to return to PLOF.    PT Frequency 2x / week    PT Duration 6 weeks    PT Treatment/Interventions ADLs/Self Care Home Management;Cryotherapy;Moist Heat;Neuromuscular re-education;Therapeutic exercise;Therapeutic activities;Taping;Vasopneumatic Device;Patient/family education;Scar mobilization;Passive range of motion;Manual techniques    PT Next Visit Plan 9 week  protocol: [email or web address]_[email or web address]   (review supine AAROM and isometrics, continue PROM as tolerated)    PT Home Exercise Plan [facility]    Consulted and Agree with Plan of Care Patient             Patient will benefit from skilled therapeutic intervention in order to improve the following deficits and impairments:  Decreased range of motion, Impaired UE functional use, Pain, Hypomobility, Impaired flexibility, Decreased strength  Visit Diagnosis: Chronic right shoulder pain  Muscle weakness (generalized)  Stiffness of right shoulder, not elsewhere classified     Problem List Patient Active Problem List   Diagnosis Date Noted   GERD (gastroesophageal reflux disease) 01/10/2018   Palpitations 01/06/2013   Hyperlipidemia 01/06/2013   Abnormal liver function tests 01/06/2013   Low back pain 11/13/2011   Myofascial muscle pain 11/13/2011   Hessie Diener, PTA   Gwendolyn Grant, PT, DPT, ATC 02/23/21 11:10 AM  Check all possible CPT codes: 59136- Therapeutic Exercise, [phone]- Neuro Re-education, [phone] - Manual Therapy, 97530 - Therapeutic Activities, and [address]35 - [facility] [place], [place], [address] Phone: [phone]   Fax:  [phone]  Name: Wayne Roberts MRN:  [id number] Date of Birth: 1970-07-04

## 2021-02-23 NOTE — Addendum Note (Signed)
Addended by: Derald Macleod on: 02/23/2021 11:17 AM   Modules accepted: Orders

## 2021-02-28 ENCOUNTER — Encounter: Payer: Self-pay | Admitting: Physical Therapy

## 2021-02-28 ENCOUNTER — Ambulatory Visit: Payer: Medicaid Other | Admitting: Physical Therapy

## 2021-02-28 ENCOUNTER — Other Ambulatory Visit: Payer: Self-pay

## 2021-02-28 DIAGNOSIS — M6281 Muscle weakness (generalized): Secondary | ICD-10-CM

## 2021-02-28 DIAGNOSIS — G8929 Other chronic pain: Secondary | ICD-10-CM

## 2021-02-28 DIAGNOSIS — M25511 Pain in right shoulder: Secondary | ICD-10-CM

## 2021-02-28 DIAGNOSIS — M25611 Stiffness of right shoulder, not elsewhere classified: Secondary | ICD-10-CM

## 2021-02-28 NOTE — Therapy (Signed)
[facility] Outpatient Rehabilitation [facility] [address] [place], [place], [address] Phone: [phone]   Fax:  [phone]  Physical Therapy Treatment  Patient Details  Name: Wayne Roberts MRN: [id number] Date of Birth: 1971/01/17 Referring Provider (PT): Bjorn Pippin, MD   Encounter Date: 02/28/2021   PT End of Session - 02/28/21 0941     Visit Number 14    Number of Visits 25    Date for PT Re-Evaluation 04/08/21    Authorization Type [employer]    Authorization Time Period 01/11/21-03/04/21    Authorization - Visit Number 10    Authorization - Number of Visits 16    PT Start Time [phone]   pt arrived late   PT Stop Time 1016    PT Time Calculation (min) 38 min    Activity Tolerance Patient tolerated treatment well    Behavior During Therapy WFL for tasks assessed/performed             Past Medical History:  Diagnosis Date   Acid reflux    Arthritis    Chronic back pain    Diabetes mellitus without complication (HCC)    states no meds now   Herniated disc    Hypercholesteremia    Hypertension     Past Surgical History:  Procedure Laterality Date   FOOT SURGERY Right    SHOULDER ARTHROSCOPY WITH DISTAL CLAVICLE RESECTION Right 12/22/2020   Procedure: SHOULDER ARTHROSCOPY WITH DISTAL CLAVICLE RESECTION;  Surgeon: Bjorn Pippin, MD;  Location: [facility];  Service: Orthopedics;  Laterality: Right;   SHOULDER ARTHROSCOPY WITH SUBACROMIAL DECOMPRESSION, ROTATOR CUFF REPAIR AND BICEP TENDON REPAIR Right 12/22/2020   Procedure: SHOULDER ARTHROSCOPY WITH SUBACROMIAL DECOMPRESSION, ROTATOR CUFF REPAIR AND BICEP TENODESIS;  Surgeon: Bjorn Pippin, MD;  Location: [facility];  Service: Orthopedics;  Laterality: Right;    There were no vitals filed for this visit.   Subjective Assessment - 02/28/21 0940     Subjective No pain. Nothing new.    Currently in Pain? No/denies                 Shoulder  Exercises: Supine   Flexion 10 reps;AROM      Shoulder Exercises: Sidelying   External Rotation 20 reps;AROM    ABduction 20 reps;AROM      Shoulder Exercises: Standing   Other Standing Exercises Wall slides flexion and abduction 1x15 each      Shoulder Exercises: Pulleys   Flexion 2 minutes    ABduction 1 minute    Other Pulley Exercises      Shoulder Exercises: Isometric Strengthening   Extension 5X5"    Extension Limitations 2 sets , towel at doorway    External Rotation 5X5"    External Rotation Limitations 2 sets, towel at doorway    Internal Rotation 5X5"    Internal Rotation Limitations 2 sets, towel at doorway    ABduction 5X5"    ABduction Limitations 2 sets , towel at doorway      Manual Therapy   Passive ROM PROM to tolerance flexion, abduction, ER,IR, horiz abd, horiz add                              PT Short Term Goals - 02/23/21 0950       PT SHORT TERM GOAL #1   Title Pt will achieve WNL R shoulder PROM in all planes in order  to progress to phase II of his rehab protocol.    Baseline 02/03/21-6weeks post op- able to progress past initial PROM restrictions of 140 flex, 60 abduct, and 40 ER; 02/23/21: not yet WNL- see objective measures    Time 4    Status On-going    Target Date 01/25/21      PT SHORT TERM GOAL #2   Title Pt will report understanding and adherence to his HEP in order to promote independence in the management of his primary impairments.    Baseline HEP provided at eval; 02/03/21- independent with initial HEP    Time 4    Period Weeks    Status Achieved    Target Date 01/25/21               PT Long Term Goals - 02/23/21 0951       PT LONG TERM GOAL #1   Title Pt will achieve WNL R shoulder AAROM in all planes in order to get dressed without limitation.    Baseline Unable to assess at eval due to shoulder restrictions.; 02/23/21: improving AAROM-see objective measures    Time 8    Period Weeks    Status  On-going      PT LONG TERM GOAL #2   Title Pt will achieve 4+/5 parascapular strength BIL in all planes in order to promote WNL long-term scapular mechanics.    Baseline Unable to assess due to shoulder restrictions 02/23/21: unable to assess due to shoulder restriction    Time 8    Period Weeks    Status Unable to assess      PT LONG TERM GOAL #3   Title Pt will demonstrate ability to perform rotator cuff isometric strengthening without increase in pain in order to progress to phase III of his rehab protocol.    Baseline Unable to perform at eval due to shoulder restrictions; 02/23/21 : able to perfrom without increased pain    Time 8    Period Weeks    Status Achieved    Target Date 02/22/21      PT LONG TERM GOAL #4   Title Pt will demonstrate 5/5 R elbow flexion and extension MMT in order to progress his independent UE strengthening regimen.    Baseline Unable to assess due to surgical restrictions; 02/23/21: unable to assess    Time 8    Period Weeks    Status Unable to assess    Target Date 02/22/21                   Plan - 02/28/21 1048     Clinical Impression Statement Mr. Bhatnagar is doing well without increased pain today. Began UBE with good tolerance. Continued with PROM.AAROM and AROM without increased pain. Will begin 10 week protocol next visit.    PT Treatment/Interventions ADLs/Self Care Home Management;Cryotherapy;Moist Heat;Neuromuscular re-education;Therapeutic exercise;Therapeutic activities;Taping;Vasopneumatic Device;Patient/family education;Scar mobilization;Passive range of motion;Manual techniques    PT Next Visit Plan check on insurance AUTH; 10 week  protocol (begin light strengthening per protocol) continued PROM : [email or web address]_[email or web address]    PT Home Exercise Plan [facility]             Patient will benefit from skilled therapeutic intervention in order to improve the following deficits and  impairments:  Decreased range of motion, Impaired UE functional use, Pain, Hypomobility, Impaired flexibility, Decreased strength  Visit Diagnosis: Chronic right shoulder pain  Muscle weakness (generalized)  Stiffness of right shoulder, not elsewhere classified  Problem List Patient Active Problem List   Diagnosis Date Noted   GERD (gastroesophageal reflux disease) 01/10/2018   Palpitations 01/06/2013   Hyperlipidemia 01/06/2013   Abnormal liver function tests 01/06/2013   Low back pain 11/13/2011   Myofascial muscle pain 11/13/2011    Sherrie Mustache, PTA 02/28/2021, 11:20 AM  [facility] [address] [place], [place], [address] Phone: [phone]   Fax:  [phone]  Name: Wayne Roberts MRN: [id number] Date of Birth: 18-Nov-1970

## 2021-03-02 ENCOUNTER — Ambulatory Visit: Payer: Medicaid Other

## 2021-03-07 ENCOUNTER — Other Ambulatory Visit: Payer: Self-pay

## 2021-03-07 ENCOUNTER — Ambulatory Visit: Payer: Medicaid Other

## 2021-03-07 DIAGNOSIS — M6281 Muscle weakness (generalized): Secondary | ICD-10-CM

## 2021-03-07 DIAGNOSIS — M25611 Stiffness of right shoulder, not elsewhere classified: Secondary | ICD-10-CM

## 2021-03-07 DIAGNOSIS — M25511 Pain in right shoulder: Secondary | ICD-10-CM

## 2021-03-07 NOTE — Therapy (Signed)
[place], [place], [address] Phone: [phone]   Fax:  [phone]  Physical Therapy Treatment/Progress Note  Patient Details  Name: Wayne Roberts MRN: [id number] Date of Birth: Dec 20, 1970 Referring Provider (PT): Hiram Gash, MD   Encounter Date: 03/07/2021   PT End of Session - 03/07/21 0919     Visit Number 15    Number of Visits 25    Date for PT Re-Evaluation 04/08/21    Authorization Type [employer]- requesting additional auth    PT Start Time 0928    PT Stop Time 1010    PT Time Calculation (min) 42 min    Activity Tolerance Patient tolerated treatment well    Behavior During Therapy [facility] for tasks assessed/performed             Past Medical History:  Diagnosis Date   Acid reflux    Arthritis    Chronic back pain    Diabetes mellitus without complication ([place])    states no meds now   Herniated disc    Hypercholesteremia    Hypertension     Past Surgical History:  Procedure Laterality Date   FOOT SURGERY Right    SHOULDER ARTHROSCOPY WITH DISTAL CLAVICLE RESECTION Right 12/22/2020   Procedure: SHOULDER ARTHROSCOPY WITH DISTAL CLAVICLE RESECTION;  Surgeon: Hiram Gash, MD;  Location: [place];  Service: Orthopedics;  Laterality: Right;   SHOULDER ARTHROSCOPY WITH SUBACROMIAL DECOMPRESSION, ROTATOR CUFF REPAIR AND BICEP TENDON REPAIR Right 12/22/2020   Procedure: SHOULDER ARTHROSCOPY WITH SUBACROMIAL DECOMPRESSION, ROTATOR CUFF REPAIR AND BICEP TENODESIS;  Surgeon: Hiram Gash, MD;  Location: [place];  Service: Orthopedics;  Laterality: Right;    There were no vitals filed for this visit.   Subjective Assessment - 03/07/21 0930     Subjective Patient reports the shoulder has been feeling "pretty good." He denies any pain recently. He is unsure when his next f/u with surgeon is scheduled.    Currently in Pain? No/denies                 [facility] PT Assessment - 03/07/21 0001       AROM   Right Shoulder Flexion 150 Degrees    Right Shoulder ABduction 145 Degrees    Right Shoulder Internal Rotation 60 Degrees    Right Shoulder External Rotation 80 Degrees      PROM   Right Shoulder Flexion 161 Degrees    Right Shoulder ABduction 151 Degrees    Right Shoulder Internal Rotation 65 Degrees    Right Shoulder External Rotation 85 Degrees      Strength   Right Elbow Flexion 4+/5    Right Elbow Extension 4+/5    Left Elbow Flexion 5/5    Left Elbow Extension 5/5                  TREATMENT 03/07/2021:  Therapeutic Exercise: - Pulleys 2 minute each flexion and abduction - reactive isometric ER/IR green band 2 x 10 RUE  - resisted shoulder extension red band 2 x 10  - standing bicep curl yellow band 2 x 10 RUE  Manual Therapy: - PROM of Rt shoulder in all planes to tolerance - Rt GHJ mobilizations grade II-III inferior, anterior and posterior   Neuromuscular re-ed: - N/A  Therapeutic Activity: - education on progress towards goals and remaining impairments that will continue to be addressed in PT.   Self-care/Home Management: - N/A  PT Short Term Goals - 03/07/21 0929       PT SHORT TERM GOAL #1   Title Pt will achieve WNL R shoulder PROM in all planes in order to progress to phase II of his rehab protocol.    Baseline 02/03/21-6weeks post op- able to progress past initial PROM restrictions of 140 flex, 60 abduct, and 40 ER; 12/20: ER WNL, all other ROM progressing see flowsheet for objective measures    Time 4    Status Partially Met    Target Date 01/25/21      PT SHORT TERM GOAL #2   Title Pt will report understanding and adherence to his HEP in order to promote independence in the management of his primary impairments.    Baseline HEP provided at eval; 02/03/21- independent with initial HEP    Time 4    Period Weeks    Status Achieved    Target  Date 01/25/21               PT Long Term Goals - 03/07/21 0948       PT LONG TERM GOAL #1   Title Pt will achieve WNL R shoulder AAROM in all planes in order to get dressed without limitation.    Baseline Unable to assess at eval due to shoulder restrictions.; 03/07/21: AROM continues to improve, see flowsheet for objective measures    Time 8    Period Weeks    Status On-going      PT LONG TERM GOAL #2   Title Pt will achieve 4+/5 parascapular strength BIL in all planes in order to promote WNL long-term scapular mechanics.    Baseline Unable to assess due to shoulder restrictions 03/07/21: unable to assess due to protocol    Time 8    Period Weeks    Status Unable to assess      PT LONG TERM GOAL #3   Title Pt will demonstrate ability to perform rotator cuff isometric strengthening without increase in pain in order to progress to phase III of his rehab protocol.    Baseline Unable to perform at eval due to shoulder restrictions; 02/23/21 : able to perfrom without increased pain    Time 8    Period Weeks    Status Achieved    Target Date 02/22/21      PT LONG TERM GOAL #4   Title Pt will demonstrate 5/5 R elbow flexion and extension MMT in order to progress his independent UE strengthening regimen.    Baseline Unable to assess due to surgical restrictions; 03/07/21: 4+/5    Time 8    Period Weeks    Status On-going    Target Date 02/22/21                   Plan - 03/07/21 0925     Clinical Impression Statement Patient is 10.5 weeks s/p Rt rotator cuff repair and is progressing as anticipated per his surgical protocol. His PROM continues to improve having achieved functional ER PROM and is making gains towards full flexion, abduction, and IR. His AROM is progressing as expected making gradual gains towards full range. He is tolerating isometric rotator cuff strengthening well with plans to begin isotonic strengthening later this week. He will benefit from  continuing with current POC (2x week for 6 weeks) to address his lingering strength and ROM deficits in order to optimize his function.    PT Treatment/Interventions ADLs/Self Care Home Management;Cryotherapy;Moist Heat;Neuromuscular re-education;Therapeutic exercise;Therapeutic  activities;Taping;Vasopneumatic Device;Patient/family education;Scar mobilization;Passive range of motion;Manual techniques    PT Next Visit Plan progress per post-op protocol; [email or web address]    PT Home Exercise Plan [facility]    Consulted and Agree with Plan of Care Patient             Patient will benefit from skilled therapeutic intervention in order to improve the following deficits and impairments:  Decreased range of motion, Impaired UE functional use, Pain, Hypomobility, Impaired flexibility, Decreased strength  Visit Diagnosis: Chronic right shoulder pain  Muscle weakness (generalized)  Stiffness of right shoulder, not elsewhere classified     Problem List Patient Active Problem List   Diagnosis Date Noted   GERD (gastroesophageal reflux disease) 01/10/2018   Palpitations 01/06/2013   Hyperlipidemia 01/06/2013   Abnormal liver function tests 01/06/2013   Low back pain 11/13/2011   Myofascial muscle pain 11/13/2011   Check all possible CPT codes: 97110- Therapeutic Exercise, 97112- Neuro Re-education, 97140 - Manual Therapy, 97530 - Therapeutic Activities, and 97535 - [place] Shawn Carattini, PT, DPT, ATC 03/07/21 10:11 AM  [place], [place], [address] Phone: [phone]   Fax:  [phone]  Name: Wayne Roberts MRN: [id number] Date of Birth: [birth year]

## 2021-03-09 ENCOUNTER — Other Ambulatory Visit: Payer: Self-pay

## 2021-03-09 ENCOUNTER — Encounter: Payer: Self-pay | Admitting: Physical Therapy

## 2021-03-09 ENCOUNTER — Ambulatory Visit: Payer: Medicaid Other | Admitting: Physical Therapy

## 2021-03-09 DIAGNOSIS — M25511 Pain in right shoulder: Secondary | ICD-10-CM | POA: Diagnosis not present

## 2021-03-09 DIAGNOSIS — G8929 Other chronic pain: Secondary | ICD-10-CM

## 2021-03-09 DIAGNOSIS — M25611 Stiffness of right shoulder, not elsewhere classified: Secondary | ICD-10-CM

## 2021-03-09 DIAGNOSIS — M6281 Muscle weakness (generalized): Secondary | ICD-10-CM

## 2021-03-09 NOTE — Therapy (Signed)
[place], [place], [address] Phone: [phone]   Fax:  [phone]  Physical Therapy Treatment  Patient Details  Name: Wayne Roberts MRN: [id number] Date of Birth: 07-18-1970 Referring Provider (PT): Hiram Gash, MD   Encounter Date: 03/09/2021   PT End of Session - 03/09/21 0935     Visit Number 16    Number of Visits 25    Date for PT Re-Evaluation 04/08/21    Authorization Type [employer]- requesting additional auth    Authorization Time Period 01/11/21-03/04/21    Authorization - Visit Number 10    Authorization - Number of Visits 16    PT Start Time 0930    PT Stop Time 1015    PT Time Calculation (min) 45 min             Past Medical History:  Diagnosis Date   Acid reflux    Arthritis    Chronic back pain    Diabetes mellitus without complication ([place])    states no meds now   Herniated disc    Hypercholesteremia    Hypertension     Past Surgical History:  Procedure Laterality Date   FOOT SURGERY Right    SHOULDER ARTHROSCOPY WITH DISTAL CLAVICLE RESECTION Right 12/22/2020   Procedure: SHOULDER ARTHROSCOPY WITH DISTAL CLAVICLE RESECTION;  Surgeon: Hiram Gash, MD;  Location: [place];  Service: Orthopedics;  Laterality: Right;   SHOULDER ARTHROSCOPY WITH SUBACROMIAL DECOMPRESSION, ROTATOR CUFF REPAIR AND BICEP TENDON REPAIR Right 12/22/2020   Procedure: SHOULDER ARTHROSCOPY WITH SUBACROMIAL DECOMPRESSION, ROTATOR CUFF REPAIR AND BICEP TENODESIS;  Surgeon: Hiram Gash, MD;  Location: [place];  Service: Orthopedics;  Laterality: Right;    There were no vitals filed for this visit.  TREATMENT 03/07/2021:  Therapeutic Exercise: - UBE level 1 x 3 min each way - Pulleys 2 minute each flexion and abduction - resisted shoulder IR and ER yellow and 2 x 10 each  - resisted shoulder extension red band 2 x 10 bilat -scapular row green band x  20  - standing bicep curl yellow band 2 x 10 RUE -standing forward raise  right 10 x 2  Standing lateral raise right 10 x 2   Manual Therapy: - PROM of Rt shoulder in all planes to tolerance - Rt GHJ mobilizations grade II-III inferior, anterior and posterior   Neuromuscular re-ed: - N/A  Therapeutic Activity: - N/A  Self-care/Home Management: - N/A                  PT Education - 03/09/21 1123     Education Details HEP    Person(s) Educated Patient    Methods Explanation;Handout    Comprehension Verbalized understanding              PT Short Term Goals - 03/07/21 0929       PT SHORT TERM GOAL #1   Title Pt will achieve WNL R shoulder PROM in all planes in order to progress to phase II of his rehab protocol.    Baseline 02/03/21-6weeks post op- able to progress past initial PROM restrictions of 140 flex, 60 abduct, and 40 ER; 12/20: ER WNL, all other ROM progressing see flowsheet for objective measures    Time 4    Status Partially Met    Target Date 01/25/21      PT SHORT TERM GOAL #2   Title Pt will report understanding and  adherence to his HEP in order to promote independence in the management of his primary impairments.    Baseline HEP provided at eval; 02/03/21- independent with initial HEP    Time 4    Period Weeks    Status Achieved    Target Date 01/25/21               PT Long Term Goals - 03/07/21 0948       PT LONG TERM GOAL #1   Title Pt will achieve WNL R shoulder AAROM in all planes in order to get dressed without limitation.    Baseline Unable to assess at eval due to shoulder restrictions.; 03/07/21: AROM continues to improve, see flowsheet for objective measures    Time 8    Period Weeks    Status On-going      PT LONG TERM GOAL #2   Title Pt will achieve 4+/5 parascapular strength BIL in all planes in order to promote WNL long-term scapular mechanics.    Baseline Unable to assess due to shoulder restrictions 03/07/21:  unable to assess due to protocol    Time 8    Period Weeks    Status Unable to assess      PT LONG TERM GOAL #3   Title Pt will demonstrate ability to perform rotator cuff isometric strengthening without increase in pain in order to progress to phase III of his rehab protocol.    Baseline Unable to perform at eval due to shoulder restrictions; 02/23/21 : able to perfrom without increased pain    Time 8    Period Weeks    Status Achieved    Target Date 02/22/21      PT LONG TERM GOAL #4   Title Pt will demonstrate 5/5 R elbow flexion and extension MMT in order to progress his independent UE strengthening regimen.    Baseline Unable to assess due to surgical restrictions; 03/07/21: 4+/5    Time 8    Period Weeks    Status On-going    Target Date 02/22/21                   Plan - 03/09/21 9390     Clinical Impression Statement Pt is 11 weeks S/P RTC repair. Began UBE, resisted rows, and  light RTC strengthening with least resisted band and he tolerated this well without increased pain. He was issued an updated HEP and cautioned to  avoid increasing his pain.    PT Treatment/Interventions ADLs/Self Care Home Management;Cryotherapy;Moist Heat;Neuromuscular re-education;Therapeutic exercise;Therapeutic activities;Taping;Vasopneumatic Device;Patient/family education;Scar mobilization;Passive range of motion;Manual techniques    PT Next Visit Plan progress per post-op protocol; [email or web address]    PT Home Exercise Plan [facility]    Consulted and Agree with Plan of Care Patient             Patient will benefit from skilled therapeutic intervention in order to improve the following deficits and impairments:  Decreased range of motion, Impaired UE functional use, Pain, Hypomobility, Impaired flexibility, Decreased strength  Visit Diagnosis: Chronic right shoulder pain  Muscle weakness (generalized)  Stiffness of right  shoulder, not elsewhere classified     Problem List Patient Active Problem List   Diagnosis Date Noted   GERD (gastroesophageal reflux disease) 01/10/2018   Palpitations 01/06/2013   Hyperlipidemia 01/06/2013   Abnormal liver function tests 01/06/2013   Low back pain 11/13/2011   Myofascial muscle pain 11/13/2011    Dorene Ar, PTA 03/09/2021, 11:24 AM  [place]  [facility] [address] [place], [place], [address] Phone: [phone]   Fax:  [phone]  Name: Wayne Roberts MRN: [id number] Date of Birth: 11/11/1970

## 2021-03-09 NOTE — Patient Instructions (Signed)
Access Code: [email or web address] URL: [email or web address] Date: 03/09/2021 Prepared by: Jannette Spanner  Exercises Circular Shoulder Pendulum with Table Support - 1 x daily - 7 x weekly - 3 sets - 20 reps Supine Shoulder Flexion PROM - 1 x daily - 7 x weekly - 3 sets - 10 reps - 5sec hold Putty Squeezes - 1 x daily - 7 x weekly - 3 sets - hold Standing Isometric Shoulder External Rotation with Doorway - 1 x daily - 7 x weekly - 10 sets - 10sec hold Standing Isometric Shoulder Internal Rotation at Doorway - 1 x daily - 7 x weekly - 10 sets - 10sec hold Isometric Shoulder Flexion at Wall - 1 x daily - 7 x weekly - 10 sets - 10sec hold Isometric Shoulder Extension at Wall - 1 x daily - 7 x weekly - 10 sets - 10sec hold Standing Isometric Shoulder Abduction with Doorway - Arm Bent - 1 x daily - 7 x weekly - 3 sets - 10 reps Standing Shoulder External Rotation AAROM with Dowel - 1 x daily - 7 x weekly - 3 sets - 10 reps Scaption Wall Slide with Towel - 1 x daily - 7 x weekly - 2 sets - 10 reps Standing Shoulder Abduction Slides at Wall - 1 x daily - 7 x weekly - 2 sets - 10 reps Shoulder External Rotation with Anchored Resistance - 1 x daily - 7 x weekly - 2 sets - 10 reps Shoulder Internal Rotation with Resistance - 1 x daily - 7 x weekly - 2 sets - 10 reps Standing Single Arm Elbow Flexion with Resistance - 1 x daily - 7 x weekly - 2 sets - 10 reps

## 2021-03-16 ENCOUNTER — Other Ambulatory Visit: Payer: Self-pay

## 2021-03-16 ENCOUNTER — Ambulatory Visit: Payer: Medicaid Other | Admitting: Physical Therapy

## 2021-03-16 ENCOUNTER — Encounter: Payer: Self-pay | Admitting: Physical Therapy

## 2021-03-16 DIAGNOSIS — M25611 Stiffness of right shoulder, not elsewhere classified: Secondary | ICD-10-CM

## 2021-03-16 DIAGNOSIS — G8929 Other chronic pain: Secondary | ICD-10-CM

## 2021-03-16 DIAGNOSIS — M25511 Pain in right shoulder: Secondary | ICD-10-CM | POA: Diagnosis not present

## 2021-03-16 DIAGNOSIS — M6281 Muscle weakness (generalized): Secondary | ICD-10-CM

## 2021-03-16 NOTE — Therapy (Signed)
[place], [place], [address] Phone: [phone]   Fax:  [phone]  Physical Therapy Treatment  Patient Details  Name: Wayne Roberts MRN: [id number] Date of Birth: 1971/01/31 Referring Provider (PT): Hiram Gash, MD   Encounter Date: 03/16/2021   PT End of Session - 03/16/21 1237     Visit Number 17    Number of Visits 25    Date for PT Re-Evaluation 04/08/21    Authorization Type [employer]-16 visits,  additonal 12 visits starting 03/08/21    Authorization Time Period 01/11/21-03/04/21;   12/22/1/30/22    Authorization - Visit Number 2    Authorization - Number of Visits 12    PT Start Time 1230    PT Stop Time 1310    PT Time Calculation (min) 40 min             Past Medical History:  Diagnosis Date   Acid reflux    Arthritis    Chronic back pain    Diabetes mellitus without complication ([place])    states no meds now   Herniated disc    Hypercholesteremia    Hypertension     Past Surgical History:  Procedure Laterality Date   FOOT SURGERY Right    SHOULDER ARTHROSCOPY WITH DISTAL CLAVICLE RESECTION Right 12/22/2020   Procedure: SHOULDER ARTHROSCOPY WITH DISTAL CLAVICLE RESECTION;  Surgeon: Hiram Gash, MD;  Location: [place];  Service: Orthopedics;  Laterality: Right;   SHOULDER ARTHROSCOPY WITH SUBACROMIAL DECOMPRESSION, ROTATOR CUFF REPAIR AND BICEP TENDON REPAIR Right 12/22/2020   Procedure: SHOULDER ARTHROSCOPY WITH SUBACROMIAL DECOMPRESSION, ROTATOR CUFF REPAIR AND BICEP TENODESIS;  Surgeon: Hiram Gash, MD;  Location: [place];  Service: Orthopedics;  Laterality: Right;    There were no vitals filed for this visit.   Subjective Assessment - 03/16/21 1231     Subjective Just a little sore with the new exercises.    Currently in Pain? No/denies              TREATMENT 03/07/2021:  Therapeutic Exercise: - UBE level 2 x 3 min  each way - Pulleys 2 minute each flexion and abduction - resisted shoulder IR and ER red band 2 x 10 each  - resisted shoulder extension green band 2 x 10 bilat -scapular row green band x 20  - standing bicep curl red band 2 x 10 RUE -standing forward raise  right 10 x 3 1# -Standing lateral raise right 10 x 3  1# - shoulder taps while leaning on counter to begin UE weightbearing x 10 each  Manual Therapy: - PROM of Rt shoulder in all planes to tolerance - Rt GHJ mobilizations grade II-III inferior, anterior and posterior   Neuromuscular re-ed: - N/A  Therapeutic Activity: - N/A  Self-care/Home Management: - N/A       PT Short Term Goals - 03/07/21 0929       PT SHORT TERM GOAL #1   Title Pt will achieve WNL R shoulder PROM in all planes in order to progress to phase II of his rehab protocol.    Baseline 02/03/21-6weeks post op- able to progress past initial PROM restrictions of 140 flex, 60 abduct, and 40 ER; 12/20: ER WNL, all other ROM progressing see flowsheet for objective measures    Time 4    Status Partially Met    Target Date 01/25/21      PT SHORT TERM GOAL #2  Title Pt will report understanding and adherence to his HEP in order to promote independence in the management of his primary impairments.    Baseline HEP provided at eval; 02/03/21- independent with initial HEP    Time 4    Period Weeks    Status Achieved    Target Date 01/25/21               PT Long Term Goals - 03/07/21 0948       PT LONG TERM GOAL #1   Title Pt will achieve WNL R shoulder AAROM in all planes in order to get dressed without limitation.    Baseline Unable to assess at eval due to shoulder restrictions.; 03/07/21: AROM continues to improve, see flowsheet for objective measures    Time 8    Period Weeks    Status On-going      PT LONG TERM GOAL #2   Title Pt will achieve 4+/5 parascapular strength BIL in all planes in order to promote WNL long-term scapular mechanics.     Baseline Unable to assess due to shoulder restrictions 03/07/21: unable to assess due to protocol    Time 8    Period Weeks    Status Unable to assess      PT LONG TERM GOAL #3   Title Pt will demonstrate ability to perform rotator cuff isometric strengthening without increase in pain in order to progress to phase III of his rehab protocol.    Baseline Unable to perform at eval due to shoulder restrictions; 02/23/21 : able to perfrom without increased pain    Time 8    Period Weeks    Status Achieved    Target Date 02/22/21      PT LONG TERM GOAL #4   Title Pt will demonstrate 5/5 R elbow flexion and extension MMT in order to progress his independent UE strengthening regimen.    Baseline Unable to assess due to surgical restrictions; 03/07/21: 4+/5    Time 8    Period Weeks    Status On-going    Target Date 02/22/21                   Plan - 03/16/21 1232     Clinical Impression Statement Pt is 12 weeks S/P RTC repair. Continued with light strengthening with progression resistance and beginner UE weightbearing on counter.  He tolerated session without increased pain.    PT Treatment/Interventions ADLs/Self Care Home Management;Cryotherapy;Moist Heat;Neuromuscular re-education;Therapeutic exercise;Therapeutic activities;Taping;Vasopneumatic Device;Patient/family education;Scar mobilization;Passive range of motion;Manual techniques    PT Next Visit Plan progress per post-op protocol; [email or web address]    PT Home Exercise Plan [facility]             Patient will benefit from skilled therapeutic intervention in order to improve the following deficits and impairments:  Decreased range of motion, Impaired UE functional use, Pain, Hypomobility, Impaired flexibility, Decreased strength  Visit Diagnosis: Chronic right shoulder pain  Muscle weakness (generalized)  Stiffness of right shoulder, not elsewhere  classified     Problem List Patient Active Problem List   Diagnosis Date Noted   GERD (gastroesophageal reflux disease) 01/10/2018   Palpitations 01/06/2013   Hyperlipidemia 01/06/2013   Abnormal liver function tests 01/06/2013   Low back pain 11/13/2011   Myofascial muscle pain 11/13/2011    Dorene Ar, PTA 03/16/2021, 1:16 PM  [facility] [address] [place], [place], [address] Phone: [phone]   Fax:  [phone]  Name:  Wayne Roberts MRN: [id number] Date of Birth: [birth year]

## 2021-03-21 ENCOUNTER — Ambulatory Visit: Payer: Medicaid Other | Admitting: Physical Therapy

## 2021-03-23 ENCOUNTER — Ambulatory Visit: Payer: Medicaid Other | Attending: Orthopaedic Surgery

## 2021-03-23 ENCOUNTER — Other Ambulatory Visit: Payer: Self-pay

## 2021-03-23 DIAGNOSIS — M6281 Muscle weakness (generalized): Secondary | ICD-10-CM | POA: Insufficient documentation

## 2021-03-23 DIAGNOSIS — M25511 Pain in right shoulder: Secondary | ICD-10-CM | POA: Insufficient documentation

## 2021-03-23 DIAGNOSIS — G8929 Other chronic pain: Secondary | ICD-10-CM | POA: Insufficient documentation

## 2021-03-23 DIAGNOSIS — M25611 Stiffness of right shoulder, not elsewhere classified: Secondary | ICD-10-CM | POA: Insufficient documentation

## 2021-03-23 NOTE — Therapy (Signed)
[place], [place], [address] Phone: [phone]   Fax:  [phone]  Physical Therapy Treatment  Patient Details  Name: Wayne Roberts MRN: [id number] Date of Birth: 03-Jul-1970 Referring Provider (PT): Hiram Gash, MD   Encounter Date: 03/23/2021   PT End of Session - 03/23/21 1016     Visit Number 18    Number of Visits 25    Date for PT Re-Evaluation 04/08/21    Authorization Type [employer]    Authorization Time Period 12/22-1/30/23    Authorization - Visit Number 3    Authorization - Number of Visits 12    PT Start Time 1016    PT Stop Time 1056    PT Time Calculation (min) 40 min    Activity Tolerance Patient tolerated treatment well    Behavior During Therapy WFL for tasks assessed/performed             Past Medical History:  Diagnosis Date   Acid reflux    Arthritis    Chronic back pain    Diabetes mellitus without complication ([place])    states no meds now   Herniated disc    Hypercholesteremia    Hypertension     Past Surgical History:  Procedure Laterality Date   FOOT SURGERY Right    SHOULDER ARTHROSCOPY WITH DISTAL CLAVICLE RESECTION Right 12/22/2020   Procedure: SHOULDER ARTHROSCOPY WITH DISTAL CLAVICLE RESECTION;  Surgeon: Hiram Gash, MD;  Location: [facility];  Service: Orthopedics;  Laterality: Right;   SHOULDER ARTHROSCOPY WITH SUBACROMIAL DECOMPRESSION, ROTATOR CUFF REPAIR AND BICEP TENDON REPAIR Right 12/22/2020   Procedure: SHOULDER ARTHROSCOPY WITH SUBACROMIAL DECOMPRESSION, ROTATOR CUFF REPAIR AND BICEP TENODESIS;  Surgeon: Hiram Gash, MD;  Location: [facility];  Service: Orthopedics;  Laterality: Right;    There were no vitals filed for this visit.   Subjective Assessment - 03/23/21 1020     Subjective "The shoulder is sore today, maybe over did it with exercises yesterday."    Currently in Pain? Yes    Pain Location  Shoulder    Pain Orientation Right    Pain Descriptors / Indicators Sore              TREATMENT 03/07/2021:   Therapeutic Exercise: - UBE level 1.5 2 min each fwd/bwd  - gentle sleeper stretch 2 x 30 sec  - sidelying ER 2# RUE 2 x 10  - resisted row 2 x 15 blue band  - resisted shoulder flexion to 90 degrees 2 x 10 2# - resisted shoulder scaption to 90 degrees 2 x 10 2# - resisted shoulder IR RUE 2 x 10 green band  - resisted shoulder extension green band 2 x 10    Manual Therapy: - PROM of Rt shoulder in all planes to tolerance - Rt GHJ mobilizations grade II-III inferior, anterior and posterior    Neuromuscular re-ed: - N/A   Therapeutic Activity: - N/A   Self-care/Home Management: - N/A  [facility] PT Assessment - 03/23/21 0001       AROM   Right Shoulder Flexion 160 Degrees                                      PT Short Term Goals - 03/07/21 0929       PT SHORT TERM GOAL #1   Title Pt will  achieve WNL R shoulder PROM in all planes in order to progress to phase II of his rehab protocol.    Baseline 02/03/21-6weeks post op- able to progress past initial PROM restrictions of 140 flex, 60 abduct, and 40 ER; 12/20: ER WNL, all other ROM progressing see flowsheet for objective measures    Time 4    Status Partially Met    Target Date 01/25/21      PT SHORT TERM GOAL #2   Title Pt will report understanding and adherence to his HEP in order to promote independence in the management of his primary impairments.    Baseline HEP provided at eval; 02/03/21- independent with initial HEP    Time 4    Period Weeks    Status Achieved    Target Date 01/25/21               PT Long Term Goals - 03/07/21 0948       PT LONG TERM GOAL #1   Title Pt will achieve WNL R shoulder AAROM in all planes in order to get dressed without limitation.    Baseline Unable to assess at eval due to shoulder restrictions.; 03/07/21: AROM continues to improve,  see flowsheet for objective measures    Time 8    Period Weeks    Status On-going      PT LONG TERM GOAL #2   Title Pt will achieve 4+/5 parascapular strength BIL in all planes in order to promote WNL long-term scapular mechanics.    Baseline Unable to assess due to shoulder restrictions 03/07/21: unable to assess due to protocol    Time 8    Period Weeks    Status Unable to assess      PT LONG TERM GOAL #3   Title Pt will demonstrate ability to perform rotator cuff isometric strengthening without increase in pain in order to progress to phase III of his rehab protocol.    Baseline Unable to perform at eval due to shoulder restrictions; 02/23/21 : able to perfrom without increased pain    Time 8    Period Weeks    Status Achieved    Target Date 02/22/21      PT LONG TERM GOAL #4   Title Pt will demonstrate 5/5 R elbow flexion and extension MMT in order to progress his independent UE strengthening regimen.    Baseline Unable to assess due to surgical restrictions; 03/07/21: 4+/5    Time 8    Period Weeks    Status On-going    Target Date 02/22/21                   Plan - 03/23/21 1023     Clinical Impression Statement Patient is 13 weeks s/p RTC repair and is continuing to make good functional progress. He arrives with increased shoulder soreness today, though attributes this to overworking the shoulder yesterday. Shoulder flexion AROM has improved compared to last assessment. He tolerates PROM well with most notable restriction remaining into IR at this time. Good tolerance to progression of shoulder strengthening without reports of increased soreness or pain.    PT Treatment/Interventions ADLs/Self Care Home Management;Cryotherapy;Moist Heat;Neuromuscular re-education;Therapeutic exercise;Therapeutic activities;Taping;Vasopneumatic Device;Patient/family education;Scar mobilization;Passive range of motion;Manual techniques    PT Next Visit Plan update HEP; progress per  post-op protocol; [email or web address]    PT Home Exercise Plan [facility]             Patient will benefit from skilled therapeutic intervention in  order to improve the following deficits and impairments:  Decreased range of motion, Impaired UE functional use, Pain, Hypomobility, Impaired flexibility, Decreased strength  Visit Diagnosis: Chronic right shoulder pain  Muscle weakness (generalized)  Stiffness of right shoulder, not elsewhere classified     Problem List Patient Active Problem List   Diagnosis Date Noted   GERD (gastroesophageal reflux disease) 01/10/2018   Palpitations 01/06/2013   Hyperlipidemia 01/06/2013   Abnormal liver function tests 01/06/2013   Low back pain 11/13/2011   Myofascial muscle pain 11/13/2011   Gwendolyn Grant, PT, DPT, ATC 03/23/21 10:57 AM  [facility] [address] [place], [place], [address] Phone: [phone]   Fax:  [phone]  Name: Wayne Roberts MRN: [id number] Date of Birth: 22-Sep-1970

## 2021-03-27 ENCOUNTER — Encounter: Payer: Medicaid Other | Admitting: Physical Therapy

## 2021-03-29 ENCOUNTER — Ambulatory Visit: Payer: Medicaid Other | Admitting: Physical Therapy

## 2021-03-29 ENCOUNTER — Other Ambulatory Visit: Payer: Self-pay

## 2021-03-29 ENCOUNTER — Encounter: Payer: Self-pay | Admitting: Physical Therapy

## 2021-03-29 DIAGNOSIS — M6281 Muscle weakness (generalized): Secondary | ICD-10-CM

## 2021-03-29 DIAGNOSIS — M25511 Pain in right shoulder: Secondary | ICD-10-CM | POA: Diagnosis not present

## 2021-03-29 DIAGNOSIS — G8929 Other chronic pain: Secondary | ICD-10-CM

## 2021-03-29 DIAGNOSIS — M25611 Stiffness of right shoulder, not elsewhere classified: Secondary | ICD-10-CM

## 2021-03-29 NOTE — Therapy (Signed)
[place], [place], [address] Phone: [phone]   Fax:  [phone]  Physical Therapy Treatment  Patient Details  Name: Wayne Roberts MRN: [id number] Date of Birth: [birth year] Referring Provider (PT): Hiram Gash, MD   Encounter Date: 03/29/2021   PT End of Session - 03/29/21 1233     Visit Number 19    Number of Visits 25    Date for PT Re-Evaluation 04/08/21    Authorization Type [employer]    Authorization Time Period 12/22-1/30/23    Authorization - Visit Number 4    Authorization - Number of Visits 12    PT Start Time 1287    PT Stop Time 1308    PT Time Calculation (min) 38 min             Past Medical History:  Diagnosis Date   Acid reflux    Arthritis    Chronic back pain    Diabetes mellitus without complication ([place])    states no meds now   Herniated disc    Hypercholesteremia    Hypertension     Past Surgical History:  Procedure Laterality Date   FOOT SURGERY Right    SHOULDER ARTHROSCOPY WITH DISTAL CLAVICLE RESECTION Right 12/22/2020   Procedure: SHOULDER ARTHROSCOPY WITH DISTAL CLAVICLE RESECTION;  Surgeon: Hiram Gash, MD;  Location: [facility];  Service: Orthopedics;  Laterality: Right;   SHOULDER ARTHROSCOPY WITH SUBACROMIAL DECOMPRESSION, ROTATOR CUFF REPAIR AND BICEP TENDON REPAIR Right 12/22/2020   Procedure: SHOULDER ARTHROSCOPY WITH SUBACROMIAL DECOMPRESSION, ROTATOR CUFF REPAIR AND BICEP TENODESIS;  Surgeon: Hiram Gash, MD;  Location: [facility];  Service: Orthopedics;  Laterality: Right;    There were no vitals filed for this visit.   Subjective Assessment - 03/29/21 1233     Subjective I still cannot wash my back. That is the only thing.    Currently in Pain? No/denies             TREATMENT 03/07/2021:   Therapeutic Exercise: - UBE level 3.0,  3 min each fwd/bwd   - resisted row 2 x 15 blue band  - resisted  shoulder flexion to 90 degrees 2 x 15 3# - resisted shoulder scaption to 90 degrees 2 x 15 3# - resisted shoulder IR RUE 2 x 15 blue band  -resisted shoulder ER RUE 2  x 15 blue band  - resisted shoulder extension blue band 2 x 15 - UE ranger for AAROM IR x 10  - AAROM IR with cane , shoulder extensions with cane  -standing towel stretch for IR 3 x 30 sec  - gentle sleeper stretch 2 x 30 sec  - sidelying ER 3# RUE 2 x 15   Manual Therapy: - PROM of Rt shoulder in all planes to tolerance - Rt GHJ mobilizations grade II-III inferior, anterior and posterior    Neuromuscular re-ed: - N/A   Therapeutic Activity: - N/A   Self-care/Home Management: - N/A     PT Short Term Goals - 03/07/21 8676       PT SHORT TERM GOAL #1   Title Pt will achieve WNL R shoulder PROM in all planes in order to progress to phase II of his rehab protocol.    Baseline 02/03/21-6weeks post op- able to progress past initial PROM restrictions of 140 flex, 60 abduct, and 40 ER; 12/20: ER WNL, all other ROM progressing see flowsheet for objective measures  Time 4    Status Partially Met    Target Date 01/25/21      PT SHORT TERM GOAL #2   Title Pt will report understanding and adherence to his HEP in order to promote independence in the management of his primary impairments.    Baseline HEP provided at eval; 02/03/21- independent with initial HEP    Time 4    Period Weeks    Status Achieved    Target Date 01/25/21               PT Long Term Goals - 03/07/21 0948       PT LONG TERM GOAL #1   Title Pt will achieve WNL R shoulder AAROM in all planes in order to get dressed without limitation.    Baseline Unable to assess at eval due to shoulder restrictions.; 03/07/21: AROM continues to improve, see flowsheet for objective measures    Time 8    Period Weeks    Status On-going      PT LONG TERM GOAL #2   Title Pt will achieve 4+/5 parascapular strength BIL in all planes in order to promote  WNL long-term scapular mechanics.    Baseline Unable to assess due to shoulder restrictions 03/07/21: unable to assess due to protocol    Time 8    Period Weeks    Status Unable to assess      PT LONG TERM GOAL #3   Title Pt will demonstrate ability to perform rotator cuff isometric strengthening without increase in pain in order to progress to phase III of his rehab protocol.    Baseline Unable to perform at eval due to shoulder restrictions; 02/23/21 : able to perfrom without increased pain    Time 8    Period Weeks    Status Achieved    Target Date 02/22/21      PT LONG TERM GOAL #4   Title Pt will demonstrate 5/5 R elbow flexion and extension MMT in order to progress his independent UE strengthening regimen.    Baseline Unable to assess due to surgical restrictions; 03/07/21: 4+/5    Time 8    Period Weeks    Status On-going    Target Date 02/22/21                   Plan - 03/29/21 1255     Clinical Impression Statement Pt arrives without pain and reports IR reach behind back is his only limitiation. Continued with shoulder and RTC strengthening progression and he tolerated this well. Worked on IR ROM with increased reach from mid lumbar to upper thoracic by end of session. Updated HEP with IR towel stretch.    PT Treatment/Interventions ADLs/Self Care Home Management;Cryotherapy;Moist Heat;Neuromuscular re-education;Therapeutic exercise;Therapeutic activities;Taping;Vasopneumatic Device;Patient/family education;Scar mobilization;Passive range of motion;Manual techniques    PT Next Visit Plan update HEP; progress per post-op protocol; [email or web address]    PT Home Exercise Plan [facility]             Patient will benefit from skilled therapeutic intervention in order to improve the following deficits and impairments:  Decreased range of motion, Impaired UE functional use, Pain, Hypomobility, Impaired flexibility,  Decreased strength  Visit Diagnosis: Chronic right shoulder pain  Muscle weakness (generalized)  Stiffness of right shoulder, not elsewhere classified     Problem List Patient Active Problem List   Diagnosis Date Noted   GERD (gastroesophageal reflux disease) 01/10/2018   Palpitations 01/06/2013   Hyperlipidemia 01/06/2013  Abnormal liver function tests 01/06/2013   Low back pain 11/13/2011   Myofascial muscle pain 11/13/2011    Dorene Ar, [place] 03/29/2021, 1:07 PM  [facility] [address] [place], [place], [address] Phone: [phone]   Fax:  [phone]  Name: Wayne Roberts MRN: [id number] Date of Birth: 02-23-1971

## 2021-04-03 ENCOUNTER — Other Ambulatory Visit: Payer: Self-pay

## 2021-04-03 ENCOUNTER — Encounter: Payer: Self-pay | Admitting: Physical Therapy

## 2021-04-03 ENCOUNTER — Ambulatory Visit: Payer: Medicaid Other | Admitting: Physical Therapy

## 2021-04-03 DIAGNOSIS — M25511 Pain in right shoulder: Secondary | ICD-10-CM | POA: Diagnosis not present

## 2021-04-03 DIAGNOSIS — M25611 Stiffness of right shoulder, not elsewhere classified: Secondary | ICD-10-CM

## 2021-04-03 DIAGNOSIS — M6281 Muscle weakness (generalized): Secondary | ICD-10-CM

## 2021-04-03 DIAGNOSIS — G8929 Other chronic pain: Secondary | ICD-10-CM

## 2021-04-03 NOTE — Therapy (Signed)
[place], [place], [address] Phone: [phone]   Fax:  [phone]  Physical Therapy Treatment  Patient Details  Name: Wayne Roberts MRN: [id number] Date of Birth: 22-Jun-1970 Referring Provider (PT): Hiram Gash, MD   Encounter Date: 04/03/2021   PT End of Session - 04/03/21 1102     Visit Number 20    Number of Visits 25    Date for PT Re-Evaluation 04/08/21    Authorization Type [employer]    Authorization Time Period 12/22-1/30/23    Authorization - Visit Number 5    Authorization - Number of Visits 12    PT Start Time 1100    PT Stop Time 1145    PT Time Calculation (min) 45 min             Past Medical History:  Diagnosis Date   Acid reflux    Arthritis    Chronic back pain    Diabetes mellitus without complication ([place])    states no meds now   Herniated disc    Hypercholesteremia    Hypertension     Past Surgical History:  Procedure Laterality Date   FOOT SURGERY Right    SHOULDER ARTHROSCOPY WITH DISTAL CLAVICLE RESECTION Right 12/22/2020   Procedure: SHOULDER ARTHROSCOPY WITH DISTAL CLAVICLE RESECTION;  Surgeon: Hiram Gash, MD;  Location: [facility];  Service: Orthopedics;  Laterality: Right;   SHOULDER ARTHROSCOPY WITH SUBACROMIAL DECOMPRESSION, ROTATOR CUFF REPAIR AND BICEP TENDON REPAIR Right 12/22/2020   Procedure: SHOULDER ARTHROSCOPY WITH SUBACROMIAL DECOMPRESSION, ROTATOR CUFF REPAIR AND BICEP TENODESIS;  Surgeon: Hiram Gash, MD;  Location: [facility];  Service: Orthopedics;  Laterality: Right;    There were no vitals filed for this visit.   Subjective Assessment - 04/03/21 1102     Subjective Reaching behind my back is improving.    Currently in Pain? No/denies             TREATMENT 03/07/2021:   Therapeutic Exercise: - UBE level 3.0,  3 min each fwd/bwd   - resisted row 2 x 15 blue band  - resisted shoulder  flexion to 90 degrees 2 x 15 3# - resisted shoulder scaption to 90 degrees 2 x 15 3# - resisted shoulder IR RUE 2 x 15 blue band  -resisted shoulder ER RUE 2  x 15 blue band  - resisted shoulder extension blue band 2 x 15 - UE ranger for AAROM IR x 10  - AAROM IR with cane , shoulder extensions with cane  -standing towel stretch for IR 3 x 30 sec  - gentle sleeper stretch 2 x 30 sec  - sidelying ER 3# RUE 2 x 15 -sidelying shoulder abduction 3# 2 x 15    Manual Therapy: - PROM of Rt shoulder in all planes to tolerance - Rt GHJ mobilizations grade II-III inferior, anterior and posterior    Neuromuscular re-ed: - N/A   Therapeutic Activity: - N/A   Self-care/Home Management: - N/A    [facility] PT Assessment - 04/03/21 0001       PROM   Right Shoulder Internal Rotation --   reach to T11                                     PT Short Term Goals - 03/07/21 5732       PT  SHORT TERM GOAL #1   Title Pt will achieve WNL R shoulder PROM in all planes in order to progress to phase II of his rehab protocol.    Baseline 02/03/21-6weeks post op- able to progress past initial PROM restrictions of 140 flex, 60 abduct, and 40 ER; 12/20: ER WNL, all other ROM progressing see flowsheet for objective measures    Time 4    Status Partially Met    Target Date 01/25/21      PT SHORT TERM GOAL #2   Title Pt will report understanding and adherence to his HEP in order to promote independence in the management of his primary impairments.    Baseline HEP provided at eval; 02/03/21- independent with initial HEP    Time 4    Period Weeks    Status Achieved    Target Date 01/25/21               PT Long Term Goals - 03/07/21 0948       PT LONG TERM GOAL #1   Title Pt will achieve WNL R shoulder AAROM in all planes in order to get dressed without limitation.    Baseline Unable to assess at eval due to shoulder restrictions.; 03/07/21: AROM continues to improve, see  flowsheet for objective measures    Time 8    Period Weeks    Status On-going      PT LONG TERM GOAL #2   Title Pt will achieve 4+/5 parascapular strength BIL in all planes in order to promote WNL long-term scapular mechanics.    Baseline Unable to assess due to shoulder restrictions 03/07/21: unable to assess due to protocol    Time 8    Period Weeks    Status Unable to assess      PT LONG TERM GOAL #3   Title Pt will demonstrate ability to perform rotator cuff isometric strengthening without increase in pain in order to progress to phase III of his rehab protocol.    Baseline Unable to perform at eval due to shoulder restrictions; 02/23/21 : able to perfrom without increased pain    Time 8    Period Weeks    Status Achieved    Target Date 02/22/21      PT LONG TERM GOAL #4   Title Pt will demonstrate 5/5 R elbow flexion and extension MMT in order to progress his independent UE strengthening regimen.    Baseline Unable to assess due to surgical restrictions; 03/07/21: 4+/5    Time 8    Period Weeks    Status On-going    Target Date 02/22/21                   Plan - 04/03/21 1120     Clinical Impression Statement Pt reports his IR reach behind back is improving and he is consistent with his IR stretching at home. He reports improved ability to was his back with RUE and is reaching to lower thoracic spine with tightnes. Countined with general scap and RTC strengthening with good tolerance. Focused on IR ROM. He is progressing well toward remaining LTGs.    PT Treatment/Interventions ADLs/Self Care Home Management;Cryotherapy;Moist Heat;Neuromuscular re-education;Therapeutic exercise;Therapeutic activities;Taping;Vasopneumatic Device;Patient/family education;Scar mobilization;Passive range of motion;Manual techniques    PT Next Visit Plan update HEP; progress per post-op protocol; [email or web address]    PT Home Exercise  Plan [facility]             Patient will benefit from skilled therapeutic intervention  in order to improve the following deficits and impairments:  Decreased range of motion, Impaired UE functional use, Pain, Hypomobility, Impaired flexibility, Decreased strength  Visit Diagnosis: Chronic right shoulder pain  Stiffness of right shoulder, not elsewhere classified  Muscle weakness (generalized)     Problem List Patient Active Problem List   Diagnosis Date Noted   GERD (gastroesophageal reflux disease) 01/10/2018   Palpitations 01/06/2013   Hyperlipidemia 01/06/2013   Abnormal liver function tests 01/06/2013   Low back pain 11/13/2011   Myofascial muscle pain 11/13/2011    Dorene Ar, PTA 04/03/2021, 11:36 AM  [facility] [address] [place], [place], [address] Phone: [phone]   Fax:  [phone]  Name: Vashawn Ekstein MRN: [id number] Date of Birth: [birth year]

## 2021-04-04 ENCOUNTER — Telehealth: Payer: Self-pay

## 2021-04-04 NOTE — Telephone Encounter (Signed)
LVM notifying patient that his PT appointment tomorrow would need to be rescheduled as the provider will be out of the office. Asked that he call us back if he would like to reschedule.   Letitia Libra, PT, DPT, ATC 04/04/21 2:25 PM

## 2021-04-05 ENCOUNTER — Ambulatory Visit: Payer: Medicaid Other

## 2021-04-10 ENCOUNTER — Other Ambulatory Visit: Payer: Self-pay

## 2021-04-10 ENCOUNTER — Ambulatory Visit: Payer: Medicaid Other

## 2021-04-10 DIAGNOSIS — G8929 Other chronic pain: Secondary | ICD-10-CM

## 2021-04-10 DIAGNOSIS — M6281 Muscle weakness (generalized): Secondary | ICD-10-CM

## 2021-04-10 DIAGNOSIS — M25611 Stiffness of right shoulder, not elsewhere classified: Secondary | ICD-10-CM

## 2021-04-10 DIAGNOSIS — M25511 Pain in right shoulder: Secondary | ICD-10-CM | POA: Diagnosis not present

## 2021-04-10 NOTE — Therapy (Signed)
[place], [place], [address] Phone: [phone]   Fax:  [phone]  Physical Therapy Treatment/Re-certification/Discharge  Patient Details  Name: Wayne Roberts MRN: [id number] Date of Birth: 28-Apr-1970 Referring Provider (PT): Hiram Gash, MD   Encounter Date: 04/10/2021   PT End of Session - 04/10/21 1017     Visit Number 21    Number of Visits 25    Date for PT Re-Evaluation --   N/A D/C   Authorization Type [employer]    Authorization Time Period 12/22-1/30/23    Authorization - Visit Number 6    Authorization - Number of Visits 12    PT Start Time 1017    PT Stop Time 1041    PT Time Calculation (min) 24 min    Activity Tolerance Patient tolerated treatment well    Behavior During Therapy WFL for tasks assessed/performed             Past Medical History:  Diagnosis Date   Acid reflux    Arthritis    Chronic back pain    Diabetes mellitus without complication ([place])    states no meds now   Herniated disc    Hypercholesteremia    Hypertension     Past Surgical History:  Procedure Laterality Date   FOOT SURGERY Right    SHOULDER ARTHROSCOPY WITH DISTAL CLAVICLE RESECTION Right 12/22/2020   Procedure: SHOULDER ARTHROSCOPY WITH DISTAL CLAVICLE RESECTION;  Surgeon: Hiram Gash, MD;  Location: [facility];  Service: Orthopedics;  Laterality: Right;   SHOULDER ARTHROSCOPY WITH SUBACROMIAL DECOMPRESSION, ROTATOR CUFF REPAIR AND BICEP TENDON REPAIR Right 12/22/2020   Procedure: SHOULDER ARTHROSCOPY WITH SUBACROMIAL DECOMPRESSION, ROTATOR CUFF REPAIR AND BICEP TENODESIS;  Surgeon: Hiram Gash, MD;  Location: [facility];  Service: Orthopedics;  Laterality: Right;    There were no vitals filed for this visit.   Subjective Assessment - 04/10/21 1019     Subjective Patient reports the shoulder is doing alright. Patient reports that the only thing that is  still difficult is reaching behind his back, but if he keeps working on it on his own he knows that will get better. He denies any pain. He reports subjective overall improvement of 90%.                [facility] PT Assessment - 04/10/21 0001       AROM   Right Shoulder Flexion 165 Degrees    Right Shoulder ABduction 165 Degrees    Right Shoulder Internal Rotation 60 Degrees    Right Shoulder External Rotation 75 Degrees      PROM   Overall PROM Comments Mild limitation into Rt shoulder IR, all other planes WNL rt shoulder      Strength   Overall Strength Comments middle trap 5/5 bilaterally    Right Shoulder Flexion 5/5    Right Shoulder ABduction 5/5    Right Shoulder Internal Rotation 5/5    Right Shoulder External Rotation 5/5    Left Shoulder Flexion 5/5    Left Shoulder ABduction 5/5    Left Shoulder Internal Rotation 5/5    Left Shoulder External Rotation 5/5    Right Elbow Flexion 5/5    Right Elbow Extension 5/5    Left Elbow Flexion 5/5    Left Elbow Extension 5/5                [facility] Adult PT Treatment:  DATE: 04/10/21 Therapeutic Exercise: Reviewed and updated HEP. Including demonstration and returning demonstration. Discussed frequency, sets, reps and appropriate progression of resistance.   Therapeutic Activity: Education on re-assessment findings and lingering ROM restriction that he can continue to address as part of HEP. D/C education.                         PT Short Term Goals - 04/10/21 1044       PT SHORT TERM GOAL #1   Title Pt will achieve WNL R shoulder PROM in all planes in order to progress to phase II of his rehab protocol.    Baseline mild restriction into IR remains    Time 4    Status Partially Met    Target Date 01/25/21      PT SHORT TERM GOAL #2   Title Pt will report understanding and adherence to his HEP in order to promote independence in the management of his  primary impairments.    Baseline HEP provided at eval; 02/03/21- independent with initial HEP    Time 4    Period Weeks    Status Achieved    Target Date 01/25/21               PT Long Term Goals - 04/10/21 1045       PT LONG TERM GOAL #1   Title Pt will achieve WNL R shoulder AAROM in all planes in order to get dressed without limitation.    Baseline mild restriction into IR remains    Time 8    Period Weeks    Status Partially Met      PT LONG TERM GOAL #2   Title Pt will achieve 4+/5 parascapular strength BIL in all planes in order to promote WNL long-term scapular mechanics.    Baseline see flowsheet    Time 8    Period Weeks    Status Achieved      PT LONG TERM GOAL #3   Title Pt will demonstrate ability to perform rotator cuff isometric strengthening without increase in pain in order to progress to phase III of his rehab protocol.    Baseline no issues with rotator cuff strengthening    Time 8    Period Weeks    Status Achieved    Target Date 02/22/21      PT LONG TERM GOAL #4   Title Pt will demonstrate 5/5 R elbow flexion and extension MMT in order to progress his independent UE strengthening regimen.    Baseline see flowsheet    Time 8    Period Weeks    Status Achieved    Target Date 02/22/21                   Plan - 04/10/21 1026     Clinical Impression Statement Gwynn has made excellent progress throughout his duration of care s/p Rt RTC repair on 12/22/20. He has progressed well with his strength and ROM without reports of pain. He demonstrates full strength about the Rt shoulder. He has mild restriction remaining into Rt shoulder IR P/AROM, otherwise has normal motion about the Rt shoulder. He is pleased with his current progress and feels confident in continuing with his HEP independently to further progress his ROM. He is therefore appropriate for D/C at this time to advanced home program.    PT Treatment/Interventions ADLs/Self Care Home  Management;Cryotherapy;Moist Heat;Neuromuscular re-education;Therapeutic exercise;Therapeutic activities;Taping;Vasopneumatic Device;Patient/family education;Scar mobilization;Passive range  of motion;Manual techniques    PT Next Visit Plan --    PT Home Exercise Plan [place] and Agree with Plan of Care Patient             Patient will benefit from skilled therapeutic intervention in order to improve the following deficits and impairments:  Decreased range of motion, Impaired UE functional use, Pain, Hypomobility, Impaired flexibility, Decreased strength  Visit Diagnosis: Chronic right shoulder pain  Stiffness of right shoulder, not elsewhere classified  Muscle weakness (generalized)     Problem List Patient Active Problem List   Diagnosis Date Noted   GERD (gastroesophageal reflux disease) 01/10/2018   Palpitations 01/06/2013   Hyperlipidemia 01/06/2013   Abnormal liver function tests 01/06/2013   Low back pain 11/13/2011   Myofascial muscle pain 11/13/2011   PHYSICAL THERAPY DISCHARGE SUMMARY  Visits from Start of Care: 21  Current functional level related to goals / functional outcomes: See goals above   Remaining deficits: See impression above   Education / Equipment: See treatment above   Patient agrees to discharge. Patient goals were partially met. Patient is being discharged due to being pleased with the current functional level. Gwendolyn Grant, PT, DPT, ATC 04/10/21 12:50 PM   [facility] [address] [place], [place], [address] Phone: [phone]   Fax:  [phone]  Name: Lyndon Chapel MRN: [id number] Date of Birth: [birth year]

## 2021-04-12 ENCOUNTER — Ambulatory Visit: Payer: Medicaid Other | Admitting: Physical Therapy

## 2023-02-04 IMAGING — DX DG FOOT COMPLETE 3+V*L*
3 series · 3 of 3 positions shown · non-contrast
Comparison: None.

CLINICAL DATA: Pain.  Great toe pain.

EXAM:
LEFT FOOT - COMPLETE 3+ VIEW

[foot ap]
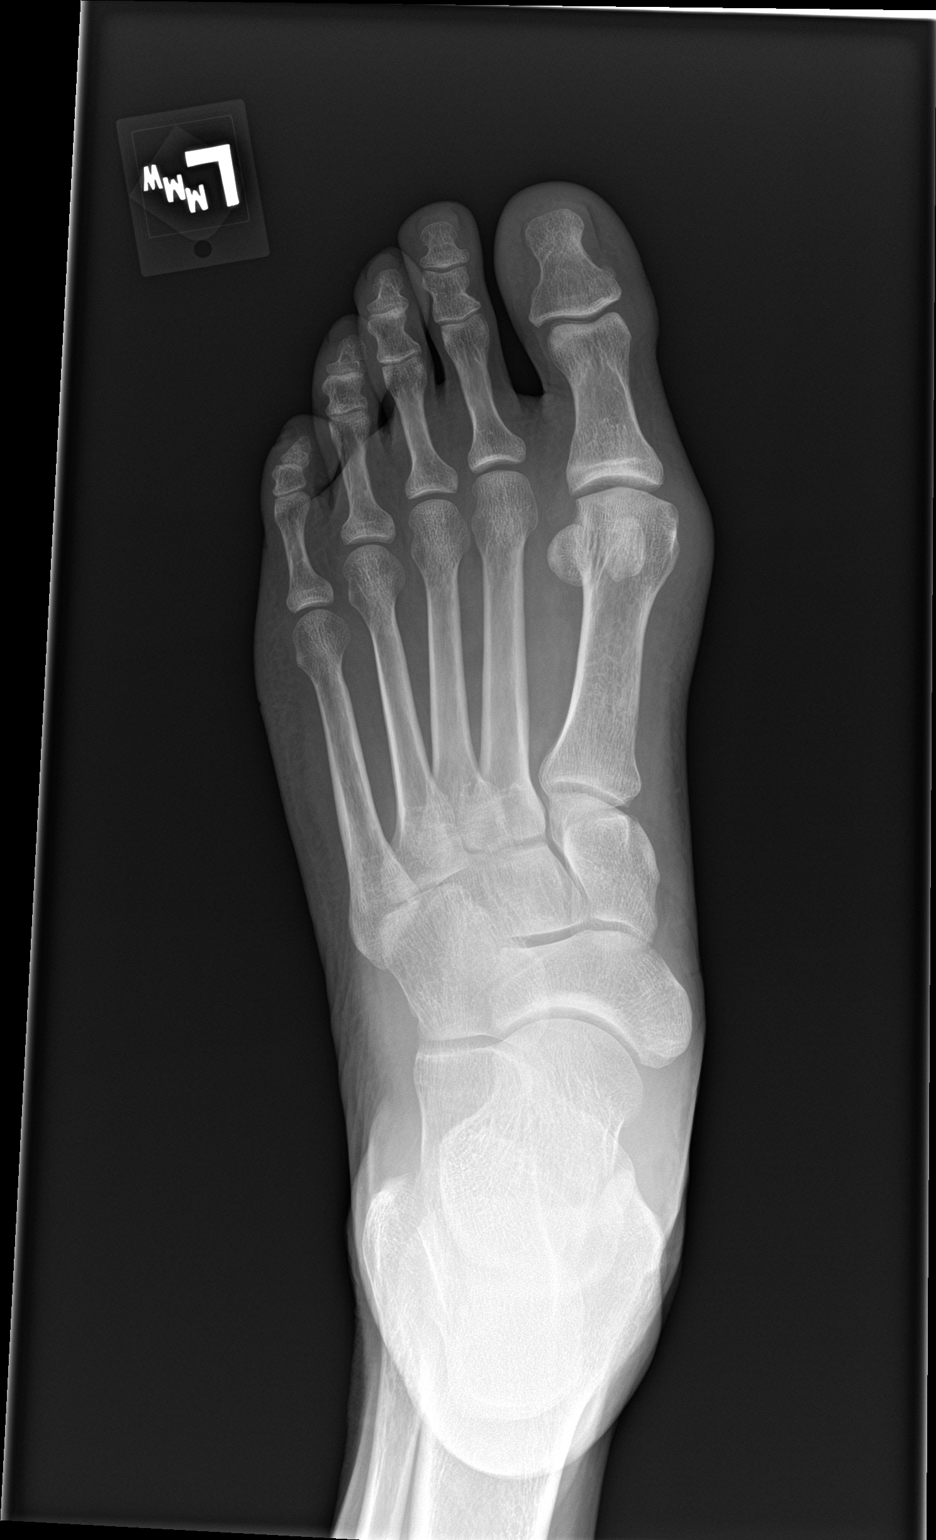

[foot obl]
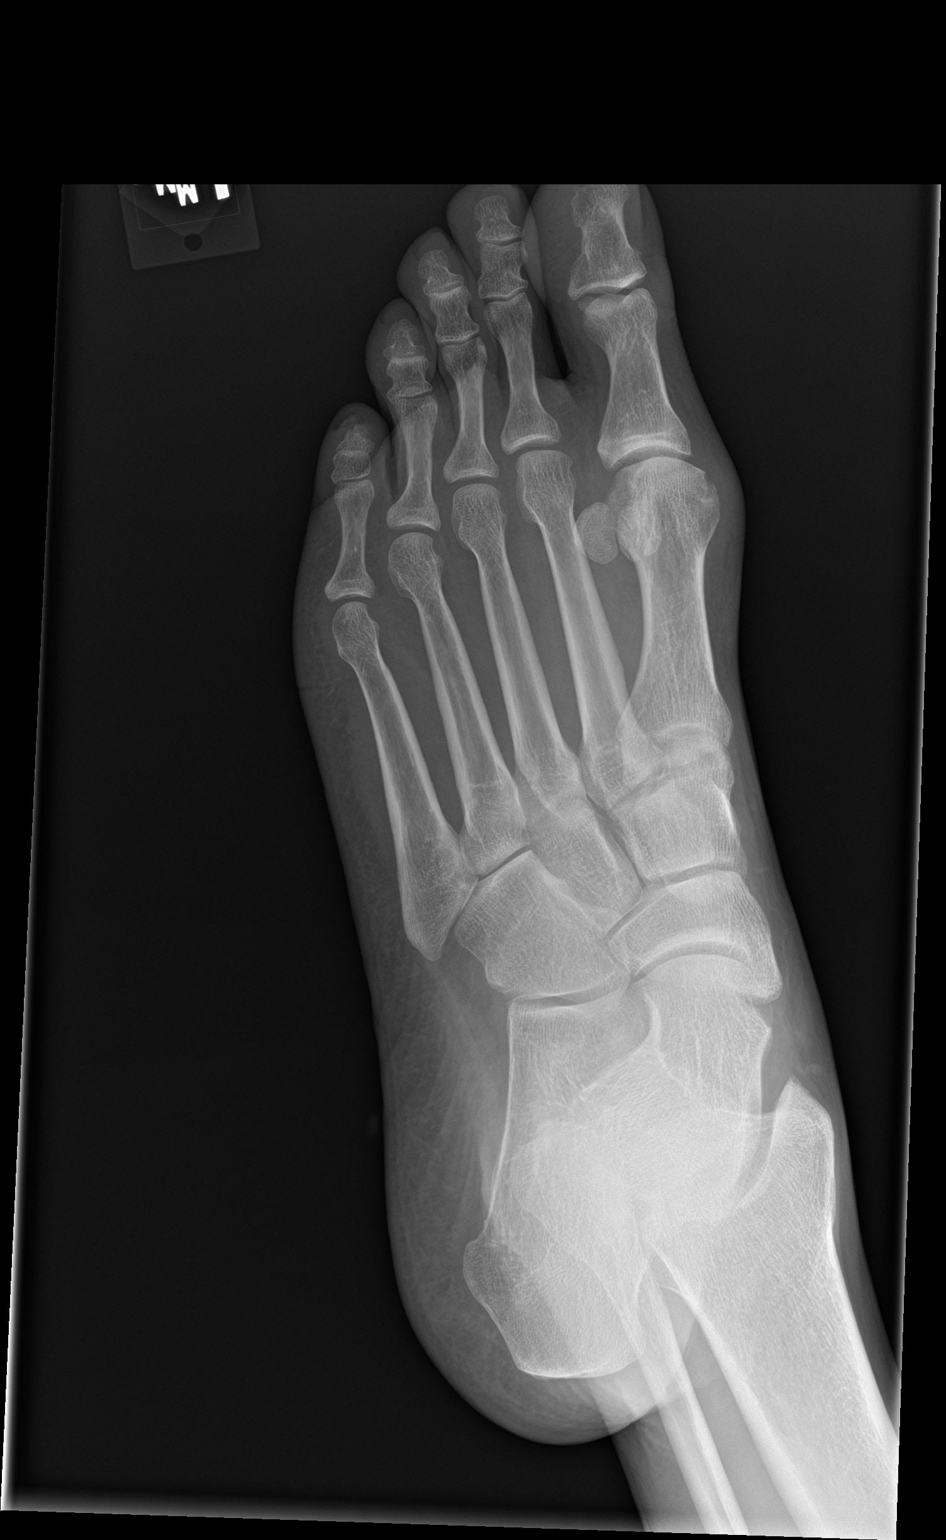

[foot lat]
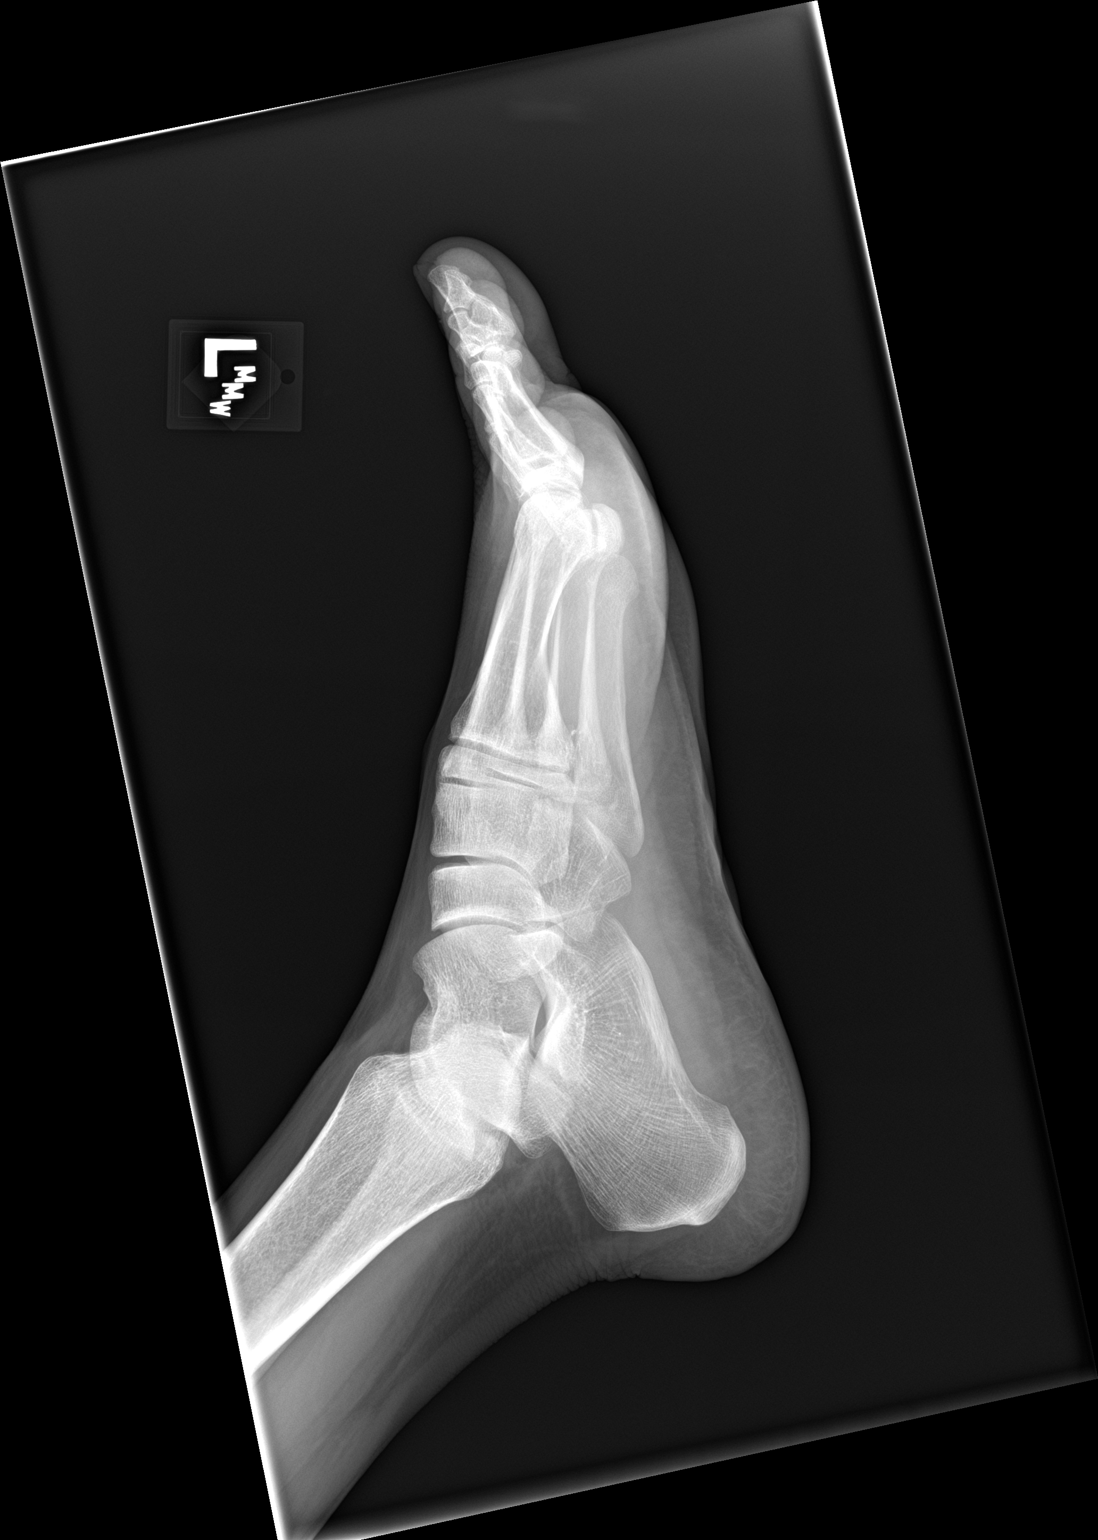

[3 of 3 positions shown; findings below may reference images not displayed]

FINDINGS: There is no evidence of fracture or dislocation. There is no
evidence of arthropathy or other focal bone abnormality. Soft
tissues are unremarkable.
IMPRESSION: Negative.

## 2023-04-29 ENCOUNTER — Encounter (HOSPITAL_BASED_OUTPATIENT_CLINIC_OR_DEPARTMENT_OTHER): Payer: Self-pay | Admitting: Orthopaedic Surgery

## 2023-04-29 ENCOUNTER — Other Ambulatory Visit: Payer: Self-pay

## 2023-05-01 ENCOUNTER — Encounter (HOSPITAL_BASED_OUTPATIENT_CLINIC_OR_DEPARTMENT_OTHER)
Admission: RE | Admit: 2023-05-01 | Discharge: 2023-05-01 | Disposition: A | Discharge status: Home / Self Care | Payer: Medicaid Other | Source: Ambulatory Visit | Attending: Orthopaedic Surgery | Admitting: Orthopaedic Surgery

## 2023-05-01 DIAGNOSIS — Z01810 Encounter for preprocedural cardiovascular examination: Secondary | ICD-10-CM | POA: Diagnosis present

## 2023-05-01 DIAGNOSIS — E119 Type 2 diabetes mellitus without complications: Secondary | ICD-10-CM | POA: Insufficient documentation

## 2023-05-01 DIAGNOSIS — M7712 Lateral epicondylitis, left elbow: Secondary | ICD-10-CM | POA: Diagnosis present

## 2023-05-01 DIAGNOSIS — M66222 Spontaneous rupture of extensor tendons, left upper arm: Secondary | ICD-10-CM | POA: Diagnosis not present

## 2023-05-01 DIAGNOSIS — Z87891 Personal history of nicotine dependence: Secondary | ICD-10-CM | POA: Diagnosis not present

## 2023-05-01 DIAGNOSIS — K219 Gastro-esophageal reflux disease without esophagitis: Secondary | ICD-10-CM | POA: Diagnosis not present

## 2023-05-01 DIAGNOSIS — G8929 Other chronic pain: Secondary | ICD-10-CM | POA: Diagnosis not present

## 2023-05-01 DIAGNOSIS — I10 Essential (primary) hypertension: Secondary | ICD-10-CM | POA: Diagnosis not present

## 2023-05-01 LAB — BASIC METABOLIC PANEL
Anion gap: 7 (ref 5–15)
BUN: 14 mg/dL (ref 6–20)
CO2: 24 mmol/L (ref 22–32)
Calcium: 9.2 mg/dL (ref 8.9–10.3)
Chloride: 107 mmol/L (ref 98–111)
Creatinine, Ser: 1.06 mg/dL (ref 0.61–1.24)
GFR, Estimated: >60 mL/min (ref >60)
Glucose, Bld: 109 mg/dL — ABNORMAL HIGH (ref 70–99)
Potassium: 4.7 mmol/L (ref 3.5–5.1)
Sodium: 138 mmol/L (ref 135–145)

## 2023-05-01 NOTE — H&P (Signed)
PREOPERATIVE H&P  Chief Complaint: LEFT DISTAL BICEP RUPTURE, LATERAL EPICONDYLITIS, LATERAL LIGAMENT INJURY  HPI: Wayne Roberts is a 53 y.o. male who is scheduled for, Procedure(s): DISTAL BICEPS TENDON REPAIR TENNIS ELBOW RELEASE/NIRSCHEL PROCEDURE LIGAMENT REPAIR.   Patient is as 53 year-old male coming in for evaluation of left elbow pain.  He was last seen in January of 2024.  At that time he received an ultrasound guided injection by Dr. Obie Dredge to the left lateral epicondyle.  He had been doing pretty well since then up until about a month ago when he was reaching for an object, heard a pop and then had subsequent pain.  Since then he has had persistent pain in both the medial and lateral epicondyle.  He denies any reduced range of motion, but states that it is difficult for him to reach out and grab objects.     Symptoms are rated as moderate to severe, and have been worsening.  This is significantly impairing activities of daily living.    Please see clinic note for further details on this patient's care.    He has elected for surgical management.   Past Medical History:  Diagnosis Date   Acid reflux    Arthritis    Chronic back pain    Diabetes mellitus without complication ([facility])    states no meds now   Herniated disc    Hypercholesteremia    Hypertension    Past Surgical History:  Procedure Laterality Date   FOOT SURGERY Right    SHOULDER ARTHROSCOPY WITH DISTAL CLAVICLE RESECTION Right 12/22/2020   Procedure: SHOULDER ARTHROSCOPY WITH DISTAL CLAVICLE RESECTION;  Surgeon: Bjorn Pippin, MD;  Location: [facility];  Service: Orthopedics;  Laterality: Right;   SHOULDER ARTHROSCOPY WITH SUBACROMIAL DECOMPRESSION, ROTATOR CUFF REPAIR AND BICEP TENDON REPAIR Right 12/22/2020   Procedure: SHOULDER ARTHROSCOPY WITH SUBACROMIAL DECOMPRESSION, ROTATOR CUFF REPAIR AND BICEP TENODESIS;  Surgeon: Bjorn Pippin, MD;  Location: [facility];  Service:  Orthopedics;  Laterality: Right;   Social History   Socioeconomic History   Marital status: Legally Separated    Spouse name: Not on file   Number of children: Not on file   Years of education: Not on file   Highest education level: Not on file  Occupational History   Not on file  Tobacco Use   Smoking status: Former    Current packs/day: 0.25    Types: Cigarettes   Smokeless tobacco: Never  Vaping Use   Vaping status: Never Used  Substance and Sexual Activity   Alcohol use: Yes    Alcohol/week: 1.0 standard drink of alcohol    Types: 1 Standard drinks or equivalent per week    Comment: social   Drug use: No   Sexual activity: Not on file  Other Topics Concern   Not on file  Social History Narrative   Not on file   Social Drivers of Health   Financial Resource Strain: Not on file  Food Insecurity: Not on file  Transportation Needs: Not on file  Physical Activity: Not on file  Stress: Not on file  Social Connections: Not on file   Family History  Problem Relation Age of Onset   Cancer - Other Father        liver   Cancer - Other Maternal Grandfather    Cancer - Other Maternal Grandmother    Allergies  Allergen Reactions   Diclofenac Nausea And Vomiting   Other  Pt has nausea with diflu? Arthritis med?   Prior to Admission medications   Medication Sig Start Date End Date Taking? Authorizing Provider  amLODipine (NORVASC) 5 MG tablet Take 5 mg by mouth daily.   Yes [provider]  omeprazole (PRILOSEC) 20 MG capsule Take 20 mg by mouth daily.   Yes [provider]  rosuvastatin (CRESTOR) 10 MG tablet Take 10 mg by mouth daily.   Yes [provider]  valsartan (DIOVAN) 160 MG tablet Take 12.5 mg by mouth daily.   Yes [provider]    ROS: All other systems have been reviewed and were otherwise negative with the exception of those mentioned in the HPI and as above.  Physical Exam: General: Alert, no acute  distress Cardiovascular: No pedal edema Respiratory: No cyanosis, no use of accessory musculature GI: No organomegaly, abdomen is soft and non-tender Skin: No lesions in the area of chief complaint Neurologic: Sensation intact distally Psychiatric: Patient is competent for consent with normal mood and affect Lymphatic: No axillary or cervical lymphadenopathy  MUSCULOSKELETAL:  Range of motion of the elbow is full.  He has gross pain with maximum supination.  He has pain with resisted supination as well.  Tender in the posterolateral elbow.  Tender to palpation with hook testing.  However, his biceps appears to be intact on hook test.    Imaging: MRI reviewed and demonstrates near complete lateral common extensor avulsion.  There is additionally a high grade at least 50% distal biceps rupture.    Assessment: LEFT DISTAL BICEP RUPTURE, LATERAL EPICONDYLITIS, LATERAL LIGAMENT INJURY  Plan: Plan for Procedure(s): DISTAL BICEPS TENDON REPAIR TENNIS ELBOW RELEASE/NIRSCHEL PROCEDURE LIGAMENT REPAIR  Risks, benefits and alternatives of a lateral epicondylitis debridement and repair with a concomitant distal biceps debridement and repair was discussed.  He understands this is an atypical procedure and that he has a high risk of stiffness, heterotopic ossification, neurovascular damage.    The risks benefits and alternatives were discussed with the patient including but not limited to the risks of nonoperative treatment, versus surgical intervention including infection, bleeding, nerve injury,  blood clots, cardiopulmonary complications, morbidity, mortality, among others, and they were willing to proceed.   The patient acknowledged the explanation, agreed to proceed with the plan and consent was signed.   Operative Plan: as above Discharge Medications: standard DVT Prophylaxis: none Physical Therapy: outpatient PT Special Discharge needs: splint. Sling.    Vernetta Honey,  PA-C  05/01/2023 8:25 PM

## 2023-05-02 ENCOUNTER — Encounter (HOSPITAL_BASED_OUTPATIENT_CLINIC_OR_DEPARTMENT_OTHER): Payer: Self-pay | Admitting: Orthopaedic Surgery

## 2023-05-02 ENCOUNTER — Ambulatory Visit (HOSPITAL_BASED_OUTPATIENT_CLINIC_OR_DEPARTMENT_OTHER): Payer: Medicaid Other

## 2023-05-02 ENCOUNTER — Ambulatory Visit (HOSPITAL_BASED_OUTPATIENT_CLINIC_OR_DEPARTMENT_OTHER): Payer: Self-pay | Admitting: Certified Registered"

## 2023-05-02 ENCOUNTER — Ambulatory Visit (HOSPITAL_BASED_OUTPATIENT_CLINIC_OR_DEPARTMENT_OTHER)
Admission: RE | Admit: 2023-05-02 | Discharge: 2023-05-02 | Disposition: A | Discharge status: Home / Self Care | Payer: Medicaid Other | Attending: Orthopaedic Surgery | Admitting: Orthopaedic Surgery

## 2023-05-02 ENCOUNTER — Other Ambulatory Visit: Payer: Self-pay

## 2023-05-02 ENCOUNTER — Encounter (HOSPITAL_BASED_OUTPATIENT_CLINIC_OR_DEPARTMENT_OTHER)
Admission: RE | Disposition: A | Discharge status: Home / Self Care | Payer: Self-pay | Source: Home / Self Care | Attending: Orthopaedic Surgery

## 2023-05-02 DIAGNOSIS — G8929 Other chronic pain: Secondary | ICD-10-CM | POA: Insufficient documentation

## 2023-05-02 DIAGNOSIS — Z87891 Personal history of nicotine dependence: Secondary | ICD-10-CM | POA: Insufficient documentation

## 2023-05-02 DIAGNOSIS — K219 Gastro-esophageal reflux disease without esophagitis: Secondary | ICD-10-CM | POA: Insufficient documentation

## 2023-05-02 DIAGNOSIS — M7712 Lateral epicondylitis, left elbow: Secondary | ICD-10-CM | POA: Insufficient documentation

## 2023-05-02 DIAGNOSIS — E119 Type 2 diabetes mellitus without complications: Secondary | ICD-10-CM | POA: Insufficient documentation

## 2023-05-02 DIAGNOSIS — I10 Essential (primary) hypertension: Secondary | ICD-10-CM | POA: Insufficient documentation

## 2023-05-02 DIAGNOSIS — M66222 Spontaneous rupture of extensor tendons, left upper arm: Secondary | ICD-10-CM | POA: Insufficient documentation

## 2023-05-02 HISTORY — PX: LIGAMENT REPAIR: SHX5444

## 2023-05-02 HISTORY — PX: TENNIS ELBOW RELEASE/NIRSCHEL PROCEDURE: SHX6651

## 2023-05-02 SURGERY — TENNIS ELBOW RELEASE/NIRSCHEL PROCEDURE
Anesthesia: General | Site: Elbow | Laterality: Left

## 2023-05-02 MED ORDER — MIDAZOLAM HCL 5 MG/5ML IJ SOLN
INTRAMUSCULAR | Status: DC | PRN
  Administered 2023-05-02: 2 mg via INTRAVENOUS

## 2023-05-02 MED ORDER — OXYCODONE HCL 5 MG PO TABS: ORAL_TABLET | ORAL | 0 refills | Status: AC

## 2023-05-02 MED ORDER — BUPIVACAINE HCL (PF) 0.25 % IJ SOLN
INTRAMUSCULAR | Status: DC | PRN
  Administered 2023-05-02: 10 mL

## 2023-05-02 MED ORDER — MIDAZOLAM HCL 2 MG/2ML IJ SOLN
INTRAMUSCULAR | Status: AC
  Filled 2023-05-02: qty 2

## 2023-05-02 MED ORDER — DEXAMETHASONE SODIUM PHOSPHATE 10 MG/ML IJ SOLN
INTRAMUSCULAR | Status: DC | PRN
  Administered 2023-05-02: 10 mg via INTRAVENOUS

## 2023-05-02 MED ORDER — VANCOMYCIN HCL 1000 MG IV SOLR
INTRAVENOUS | Status: DC | PRN
  Administered 2023-05-02: 1000 mg via TOPICAL

## 2023-05-02 MED ORDER — ACETAMINOPHEN 500 MG PO TABS: 1000.0000 mg | ORAL_TABLET | Freq: Three times a day (TID) | ORAL | 0 refills | Status: AC

## 2023-05-02 MED ORDER — PHENYLEPHRINE 80 MCG/ML (10ML) SYRINGE FOR IV PUSH (FOR BLOOD PRESSURE SUPPORT)
PREFILLED_SYRINGE | INTRAVENOUS | Status: AC
  Filled 2023-05-02: qty 10

## 2023-05-02 MED ORDER — CELECOXIB 100 MG PO CAPS
100.0000 mg | ORAL_CAPSULE | Freq: Two times a day (BID) | ORAL | 0 refills | Status: AC
Start: 2023-05-02 — End: 2023-06-01

## 2023-05-02 MED ORDER — FENTANYL CITRATE (PF) 100 MCG/2ML IJ SOLN
INTRAMUSCULAR | Status: AC
  Filled 2023-05-02: qty 2

## 2023-05-02 MED ORDER — PHENYLEPHRINE HCL (PRESSORS) 10 MG/ML IV SOLN
INTRAVENOUS | Status: DC | PRN
  Administered 2023-05-02: 80 ug via INTRAVENOUS

## 2023-05-02 MED ORDER — GABAPENTIN 300 MG PO CAPS
300.0000 mg | ORAL_CAPSULE | Freq: Once | ORAL | Status: AC
  Administered 2023-05-02: 300 mg via ORAL

## 2023-05-02 MED ORDER — CEFAZOLIN SODIUM-DEXTROSE 2-4 GM/100ML-% IV SOLN
2.0000 g | INTRAVENOUS | Status: AC
  Administered 2023-05-02: 2 g via INTRAVENOUS

## 2023-05-02 MED ORDER — OXYCODONE HCL 5 MG PO TABS: 5.0000 mg | ORAL_TABLET | Freq: Once | ORAL | Status: DC | PRN

## 2023-05-02 MED ORDER — LACTATED RINGERS IV SOLN: INTRAVENOUS | Status: DC

## 2023-05-02 MED ORDER — ONDANSETRON HCL 4 MG/2ML IJ SOLN
INTRAMUSCULAR | Status: DC | PRN
  Administered 2023-05-02: 4 mg via INTRAVENOUS

## 2023-05-02 MED ORDER — VANCOMYCIN HCL 500 MG IV SOLR
INTRAVENOUS | Status: AC
  Filled 2023-05-02: qty 20

## 2023-05-02 MED ORDER — ACETAMINOPHEN 500 MG PO TABS
1000.0000 mg | ORAL_TABLET | Freq: Once | ORAL | Status: AC
  Administered 2023-05-02: 1000 mg via ORAL

## 2023-05-02 MED ORDER — FENTANYL CITRATE (PF) 100 MCG/2ML IJ SOLN
INTRAMUSCULAR | Status: DC | PRN
  Administered 2023-05-02 (×3): 50 ug via INTRAVENOUS

## 2023-05-02 MED ORDER — ONDANSETRON HCL 4 MG PO TABS: 4.0000 mg | ORAL_TABLET | Freq: Three times a day (TID) | ORAL | 0 refills | Status: AC | PRN

## 2023-05-02 MED ORDER — AMISULPRIDE (ANTIEMETIC) 5 MG/2ML IV SOLN: 10.0000 mg | Freq: Once | INTRAVENOUS | Status: DC | PRN

## 2023-05-02 MED ORDER — FENTANYL CITRATE (PF) 100 MCG/2ML IJ SOLN
25.0000 ug | INTRAMUSCULAR | Status: DC | PRN
Start: 2023-05-02 — End: 2023-05-02

## 2023-05-02 MED ORDER — LIDOCAINE HCL (CARDIAC) PF 100 MG/5ML IV SOSY
PREFILLED_SYRINGE | INTRAVENOUS | Status: DC | PRN
  Administered 2023-05-02: 60 mg via INTRAVENOUS

## 2023-05-02 MED ORDER — ACETAMINOPHEN 500 MG PO TABS
ORAL_TABLET | ORAL | Status: AC
  Filled 2023-05-02: qty 2

## 2023-05-02 MED ORDER — OXYCODONE HCL 5 MG/5ML PO SOLN: 5.0000 mg | Freq: Once | ORAL | Status: DC | PRN

## 2023-05-02 MED ORDER — CEFAZOLIN SODIUM-DEXTROSE 2-4 GM/100ML-% IV SOLN
INTRAVENOUS | Status: AC
  Filled 2023-05-02: qty 100

## 2023-05-02 MED ORDER — DEXMEDETOMIDINE HCL IN NACL 80 MCG/20ML IV SOLN
INTRAVENOUS | Status: AC
  Filled 2023-05-02: qty 20

## 2023-05-02 MED ORDER — GABAPENTIN 300 MG PO CAPS
ORAL_CAPSULE | ORAL | Status: AC
  Filled 2023-05-02: qty 1

## 2023-05-02 MED ORDER — 0.9 % SODIUM CHLORIDE (POUR BTL) OPTIME
TOPICAL | Status: DC | PRN
  Administered 2023-05-02: 200 mL

## 2023-05-02 MED ORDER — PROPOFOL 10 MG/ML IV BOLUS
INTRAVENOUS | Status: DC | PRN
  Administered 2023-05-02: 200 mg via INTRAVENOUS

## 2023-05-02 SURGICAL SUPPLY — 63 items
ANCHOR SUT FIBERTAK DX DBL (Anchor) ×1 IMPLANT
BLADE SURG 10 STRL SS (BLADE) ×2 IMPLANT
BLADE SURG 15 STRL LF DISP TIS (BLADE) ×2 IMPLANT
BNDG COHESIVE 4X5 TAN STRL LF (GAUZE/BANDAGES/DRESSINGS) ×2 IMPLANT
BNDG ELASTIC 4INX 5YD STR LF (GAUZE/BANDAGES/DRESSINGS) ×2 IMPLANT
BNDG ESMARK 4X9 LF (GAUZE/BANDAGES/DRESSINGS) ×2 IMPLANT
CHLORAPREP W/TINT 26 (MISCELLANEOUS) ×2 IMPLANT
CLSR STERI-STRIP ANTIMIC 1/2X4 (GAUZE/BANDAGES/DRESSINGS) ×2 IMPLANT
CORD BIPOLAR FORCEPS 12FT (ELECTRODE) ×1 IMPLANT
COVER BACK TABLE 60X90IN (DRAPES) ×1 IMPLANT
COVER MAYO STAND STRL (DRAPES) IMPLANT
CUFF TOURN SGL QUICK 18X3 (MISCELLANEOUS) ×2 IMPLANT
CUFF TOURN SGL QUICK 18X4 (TOURNIQUET CUFF) ×1 IMPLANT
CUFF TRNQT CYL 24X4X16.5-23 (TOURNIQUET CUFF) IMPLANT
DRAPE EXTREMITY T 121X128X90 (DISPOSABLE) ×2 IMPLANT
DRAPE IMP U-DRAPE 54X76 (DRAPES) IMPLANT
DRAPE INCISE IOBAN 66X45 STRL (DRAPES) ×1 IMPLANT
DRAPE OEC MINIVIEW 54X84 (DRAPES) IMPLANT
DRAPE U-SHAPE 47X51 STRL (DRAPES) ×2 IMPLANT
ELECT REM PT RETURN 9FT ADLT (ELECTROSURGICAL) ×2
ELECTRODE REM PT RTRN 9FT ADLT (ELECTROSURGICAL) ×1 IMPLANT
EXT HOSE W/PLC CONNECTION (MISCELLANEOUS)
EXTENSION HOSE W/PLC CONNECTON (MISCELLANEOUS) ×1 IMPLANT
GAUZE SPONGE 4X4 12PLY STRL (GAUZE/BANDAGES/DRESSINGS) ×2 IMPLANT
GLOVE BIO SURGEON STRL SZ 6.5 (GLOVE) ×3 IMPLANT
GLOVE BIO SURGEON STRL SZ8 (GLOVE) ×2 IMPLANT
GLOVE BIOGEL PI IND STRL 6.5 (GLOVE) ×3 IMPLANT
GLOVE BIOGEL PI IND STRL 8 (GLOVE) ×2 IMPLANT
GLOVE ECLIPSE 8.0 STRL XLNG CF (GLOVE) ×2 IMPLANT
GOWN STRL REUS W/ TWL LRG LVL3 (GOWN DISPOSABLE) ×4 IMPLANT
GOWN STRL REUS W/TWL XL LVL3 (GOWN DISPOSABLE) ×2 IMPLANT
KIT FIBERTAK DX 1.6 DISP (KITS) ×1 IMPLANT
KIT STR SPEAR 1.8 FBRTK DISP (KITS) IMPLANT
NDL MAYO CATGUT SZ4 TPR NDL (NEEDLE) ×1 IMPLANT
NEEDLE MAYO CATGUT SZ4 (NEEDLE)
NS IRRIG 1000ML POUR BTL (IV SOLUTION) ×2 IMPLANT
PACK ARTHROSCOPY DSU (CUSTOM PROCEDURE TRAY) ×1 IMPLANT
PACK BASIN DAY SURGERY FS (CUSTOM PROCEDURE TRAY) ×2 IMPLANT
PAD CAST 4YDX4 CTTN HI CHSV (CAST SUPPLIES) ×4 IMPLANT
PENCIL SMOKE EVACUATOR (MISCELLANEOUS) ×2 IMPLANT
SHEET MEDIUM DRAPE 40X70 STRL (DRAPES) ×2 IMPLANT
SLEEVE SCD COMPRESS KNEE MED (STOCKING) ×2 IMPLANT
SLING ARM FOAM STRAP LRG (SOFTGOODS) ×2 IMPLANT
SLING ARM FOAM STRAP XLG (SOFTGOODS) IMPLANT
SPIKE FLUID TRANSFER (MISCELLANEOUS) IMPLANT
SPLINT PLASTER CAST FAST 5X30 (CAST SUPPLIES) ×20 IMPLANT
SPONGE T-LAP 18X18 ~~LOC~~+RFID (SPONGE) ×2 IMPLANT
STOCKINETTE 4X48 STRL (DRAPES) ×2 IMPLANT
STOCKINETTE IMPERVIOUS LG (DRAPES) IMPLANT
SUCTION TUBE FRAZIER 10FR DISP (SUCTIONS) IMPLANT
SUT 2 FIBERLOOP 20 STRT BLUE (SUTURE)
SUT FIBERWIRE #2 38 T-5 BLUE (SUTURE)
SUT MNCRL AB 4-0 PS2 18 (SUTURE) ×2 IMPLANT
SUT VIC AB 0 CT1 27XBRD ANBCTR (SUTURE) ×2 IMPLANT
SUT VIC AB 2-0 CT1 TAPERPNT 27 (SUTURE) IMPLANT
SUT VIC AB 2-0 SH 27XBRD (SUTURE) IMPLANT
SUT VIC AB 3-0 SH 27X BRD (SUTURE) IMPLANT
SUTURE 2 FIBERLOOP 20 STRT BLU (SUTURE) ×1 IMPLANT
SUTURE FIBERWR #2 38 T-5 BLUE (SUTURE) IMPLANT
SYR BULB EAR ULCER 3OZ GRN STR (SYRINGE) ×2 IMPLANT
TOWEL GREEN STERILE FF (TOWEL DISPOSABLE) ×2 IMPLANT
TUBE SUCTION HIGH CAP CLEAR NV (SUCTIONS) ×2 IMPLANT
YANKAUER SUCT BULB TIP NO VENT (SUCTIONS) ×2 IMPLANT

## 2023-05-02 NOTE — Discharge Instructions (Addendum)
Ramond Marrow MD, MPH Alfonse Alpers, PA-C [facility] [address] [phone] (tel)   [phone] (fax)   POST-OPERATIVE INSTRUCTIONS   WOUND CARE Please keep splint clean dry and intact until followup.  You may shower on Post-Op Day #3.  You must keep splint dry during this process and may find that a plastic bag taped around the extremity or alternatively a towel based bath may be a better option.   If you get your splint wet or if it is damaged please contact our clinic.  EXERCISES Due to your splint being in place you will not be able to bear weight through your extremity.    You may use a sling for comfort   It is normal for your fingers/hand to become more swollen after surgery and discolored from bruising.   This will resolve over the first few weeks usually after surgery. Please continue to ambulate and do not stay sitting or lying for too long.  Perform foot and wrist pumps to assist in circulation.   REGIONAL ANESTHESIA (NERVE BLOCKS) The anesthesia team may have performed a nerve block for you this is a great tool used to minimize pain.   The block may start wearing off overnight (between 8-24 hours postop) When the block wears off, your pain may go from nearly zero to the pain you would have had postop without the block. This is an abrupt transition but nothing dangerous is happening.   This can be a challenging period but utilize your as needed pain medications to try and manage this period. We suggest you use the pain medication the first night prior to going to bed, to ease this transition.  You may take an extra dose of narcotic when this happens if needed   POST-OP MEDICATIONS- Multimodal approach to pain control In general your pain will be controlled with a combination of substances.  Prescriptions unless otherwise discussed are electronically sent to your pharmacy.  This is a carefully made plan we use to minimize  narcotic use.     Celebrex - Anti-inflammatory medication taken on a scheduled basis Acetaminophen - Non-narcotic pain medicine taken on a scheduled basis  Oxycodone - This is a strong narcotic, to be used only on an "as needed" basis for SEVERE pain. Zofran - take as needed for nausea   FOLLOW-UP If you develop a Fever (>101.5), Redness or Drainage from the surgical incision site, please call our office to arrange for an evaluation. Please call the office to schedule a follow-up appointment for your incision check if you do not already have one, 7-10 days post-operatively.   HELPFUL INFORMATION    You may be more comfortable sleeping in a semi-seated position the first few nights following surgery.  Keep a pillow propped under the elbow and forearm for comfort.  If you have a recliner type of chair it might be beneficial.  If not that is fine too, but it would be helpful to sleep propped up with pillows behind your operated shoulder as well under your elbow and forearm.  This will reduce pulling on the suture lines.   When dressing, put your operative arm in the sleeve first.  When getting undressed, take your operative arm out last.  Loose fitting, button-down shirts are recommended.  Often in the first days after surgery you may be more comfortable keeping your operative arm under your shirt and not through the sleeve.   You may return to work/school in the  next couple of days when you feel up to it.  Desk work and typing in the sling is fine.   We suggest you use the pain medication the first night prior to going to bed, in order to ease any pain when the anesthesia wears off. You should avoid taking pain medications on an empty stomach as it will make you nauseous.   You should wean off your narcotic medicines as soon as you are able.  Most patients will be off narcotics before their first postop appointment.    Do not drink alcoholic beverages or take illicit drugs when taking pain  medications.   It is against the law to drive while taking narcotics.  In some states it is against the law to drive while your arm is in a sling.    Pain medication may make you constipated.  Below are a few solutions to try in this order:   - Decrease the amount of pain medication if you aren't having pain.   - Drink lots of decaffeinated fluids.   - Drink prune juice and/or each dried prunes   If the first 3 don't work start with additional solutions   - Take Colace - an over-the-counter stool softener   - Take Senokot - an over-the-counter laxative   - Take Miralax - a stronger over-the-counter laxative   For more information including helpful videos and documents visit our website:   [email or web address]     Post Anesthesia Home Care Instructions  Activity: Get plenty of rest for the remainder of the day. A responsible individual must stay with you for 24 hours following the procedure.  For the next 24 hours, DO NOT: -Drive a car -Advertising copywriter -Drink alcoholic beverages -Take any medication unless instructed by your physician -Make any legal decisions or sign important papers.  Meals: Start with liquid foods such as gelatin or soup. Progress to regular foods as tolerated. Avoid greasy, spicy, heavy foods. If nausea and/or vomiting occur, drink only clear liquids until the nausea and/or vomiting subsides. Call your physician if vomiting continues.  Special Instructions/Symptoms: Your throat may feel dry or sore from the anesthesia or the breathing tube placed in your throat during surgery. If this causes discomfort, gargle with warm salt water. The discomfort should disappear within 24 hours.  If you had a scopolamine patch placed behind your ear for the management of post- operative nausea and/or vomiting:  1. The medication in the patch is effective for 72 hours, after which it should be removed.  Wrap patch in a tissue and discard  in the trash. Wash hands thoroughly with soap and water. 2. You may remove the patch earlier than 72 hours if you experience unpleasant side effects which may include dry mouth, dizziness or visual disturbances. 3. Avoid touching the patch. Wash your hands with soap and water after contact with the patch.    Post Anesthesia Home Care Instructions  Activity: Get plenty of rest for the remainder of the day. A responsible individual must stay with you for 24 hours following the procedure.  For the next 24 hours, DO NOT: -Drive a car -Advertising copywriter -Drink alcoholic beverages -Take any medication unless instructed by your physician -Make any legal decisions or sign important papers.  Meals: Start with liquid foods such as gelatin or soup. Progress to regular foods as tolerated. Avoid greasy, spicy, heavy foods. If nausea and/or vomiting occur, drink only clear liquids until the nausea and/or vomiting subsides. Call your physician if  vomiting continues.  Special Instructions/Symptoms: Your throat may feel dry or sore from the anesthesia or the breathing tube placed in your throat during surgery. If this causes discomfort, gargle with warm salt water. The discomfort should disappear within 24 hours.  Next dose Tylenol can be taken today at 130pm.

## 2023-05-02 NOTE — Transfer of Care (Signed)
Immediate Anesthesia Transfer of Care Note  Patient: [name]  Procedure(s) Performed: TENNIS ELBOW RELEASE/NIRSCHEL PROCEDURE (Left: Elbow) LIGAMENT REPAIR (Left: Elbow)  Patient Location: PACU  Anesthesia Type:General  Level of Consciousness: drowsy  Airway & Oxygen Therapy: Patient Spontanous Breathing and Patient connected to face mask oxygen  Post-op Assessment: Report given to RN and Post -op Vital signs reviewed and stable  Post vital signs: Reviewed and stable  Last Vitals:  Vitals Value Taken Time  BP 151/96 (113) 05/02/23 0913  Temp    Pulse 91 05/02/23 0914  Resp 20 05/02/23 0914  SpO2 98 % 05/02/23 0914  Vitals shown include unfiled device data.  Last Pain:  Vitals:   05/02/23 0718  TempSrc: Temporal  PainSc: 0-No pain      Patients Stated Pain Goal: 3 (05/02/23 7829)  Complications: No notable events documented.

## 2023-05-02 NOTE — Interval H&P Note (Signed)
All questions answered, patient wants to proceed with procedure. ? ?

## 2023-05-02 NOTE — Anesthesia Preprocedure Evaluation (Addendum)
Anesthesia Evaluation  Patient identified by MRN, date of birth, ID band Patient awake    Reviewed: Allergy & Precautions, NPO status , Patient's Chart, lab work & pertinent test results  Airway Mallampati: II  TM Distance: >3 FB Neck ROM: Full    Dental no notable dental hx.    Pulmonary Current Smoker and Patient abstained from smoking.   Pulmonary exam normal        Cardiovascular hypertension, Pt. on medications Normal cardiovascular exam     Neuro/Psych negative neurological ROS  negative psych ROS   GI/Hepatic Neg liver ROS,GERD  Medicated and Controlled,,  Endo/Other  negative endocrine ROS    Renal/GU negative Renal ROS     Musculoskeletal  (+) Arthritis ,  Chronic back pain   Abdominal   Peds  Hematology negative hematology ROS (+)   Anesthesia Other Findings LEFT DISTAL BICEP RUPTURE, LATERAL EPICONDYLITIS, LATERAL LIGAMENT INJURY  Reproductive/Obstetrics                             Anesthesia Physical Anesthesia Plan  ASA: 2  Anesthesia Plan: General   Post-op Pain Management:    Induction: Intravenous  PONV Risk Score and Plan: 1 and Ondansetron, Dexamethasone, Midazolam and Treatment may vary due to age or medical condition  Airway Management Planned: LMA  Additional Equipment:   Intra-op Plan:   Post-operative Plan: Extubation in OR  Informed Consent: I have reviewed the patients History and Physical, chart, labs and discussed the procedure including the risks, benefits and alternatives for the proposed anesthesia with the patient or authorized representative who has indicated his/her understanding and acceptance.     Dental advisory given  Plan Discussed with: CRNA  Anesthesia Plan Comments:        Anesthesia Quick Evaluation

## 2023-05-02 NOTE — Anesthesia Procedure Notes (Signed)
Procedure Name: LMA Insertion Date/Time: 05/02/2023 8:26 AM  Performed by: Lauralyn Primes, CRNAPre-anesthesia Checklist: Patient identified, Emergency Drugs available, Suction available and Patient being monitored Patient Re-evaluated:Patient Re-evaluated prior to induction Oxygen Delivery Method: Circle system utilized Preoxygenation: Pre-oxygenation with 100% oxygen Induction Type: IV induction Ventilation: Mask ventilation without difficulty LMA: LMA inserted LMA Size: 5.0 Number of attempts: 1 Airway Equipment and Method: Bite block Placement Confirmation: positive ETCO2 Tube secured with: Tape Dental Injury: Teeth and Oropharynx as per pre-operative assessment

## 2023-05-02 NOTE — Anesthesia Postprocedure Evaluation (Signed)
Anesthesia Post Note  Patient: Wayne Roberts  Procedure(s) Performed: TENNIS ELBOW RELEASE/NIRSCHEL PROCEDURE (Left: Elbow) LIGAMENT REPAIR (Left: Elbow)     Patient location during evaluation: PACU Anesthesia Type: General Level of consciousness: awake Pain management: pain level controlled Vital Signs Assessment: post-procedure vital signs reviewed and stable Respiratory status: spontaneous breathing, nonlabored ventilation and respiratory function stable Cardiovascular status: blood pressure returned to baseline and stable Postop Assessment: no apparent nausea or vomiting Anesthetic complications: no   No notable events documented.  Last Vitals:  Vitals:   05/02/23 0930 05/02/23 0959  BP: (!) 135/96 (!) 143/101  Pulse: 75 68  Resp: 14 16  Temp:  36.4 C  SpO2: 98% 99%    Last Pain:  Vitals:   05/02/23 0959  TempSrc: Temporal  PainSc: 0-No pain                 Alleene Stoy P Lus Kriegel

## 2023-05-02 NOTE — Op Note (Signed)
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  As the patient had some findings in his distal biceps we had initially planned on performing a repair and debridement of his partial distal biceps injury.  Based on his lack of symptoms in the preoperative area we had a long discussion with him about leaving this portion of his elbow as it was.  He understood the risks of this.  He had primarily symptoms in the lateral epicondyle.  Repair was routine and he had unhealthy tendon that was debrided and repaired the healthy tendon that was more superficial.  Patient follow standard protocol for a lateral epicondylitis repair.  Post-operative plan: The patient will be nonweightbearing in a splint for a week transition to a wrist splint after that.  The patient will be discharged home.  DVT prophylaxis not indicated in this ambulatory upper extremity patient without significant risk factors.   Pain control with PRN pain medication preferring oral medicines.  Follow up plan will be scheduled in approximately 7 days for incision check.  Post-Op Diagnosis: Same Surgeons:Primary: Bjorn Pippin, MD Assistants:Caroline McBane, PA-C Location: [facility] OR ROOM 6 Anesthesia: General with local anesthesia Antibiotics: Ancef 2 g with local vancomycin powder 1 g at the surgical site Tourniquet time: 20 Estimated Blood Loss: Minimal Complications: None Specimens: None Implants: * No implants in log *  Indications for Surgery:   Wayne Roberts is a 53 y.o. male with lateral epicondylitis and MRI findings of a partial tear.  Benefits and risks of operative and nonoperative management were discussed prior to surgery with patient/guardian(s) and informed consent form was  completed.  Specific risks including infection, need for additional surgery, rerupture, stiffness and eventual distal biceps symptoms.   Procedure:   The patient was identified properly. Informed consent was obtained and the surgical site was marked. The patient was taken up to suite where general anesthesia was induced.  The patient was positioned supine on the hand table.  The left elbow was prepped and draped in the usual sterile fashion.  Timeout was performed before the beginning of the case.  Tourniquet was used for the above duration.  We used the technique described by Nirschl et al we.  Made a 4 cm incision starting 1 cm proximal to the lateral epicondyle extending distally in line with the arm dissected through the skin sharply achieving hemostasis as we progressed.  Soft tissues were dissected bluntly with a Metzenbaum scissor and we were able to expose the extensor aponeurosis and the lateral epicondyle within our incision.  Self-retaining retractors were placed and we carefully identified the proximal and distal aspect of the epicondyle and the midportion of the extensor aponeurosis as well as the muscle belly anterior.  The extensor aponeurosis was opened in line with its fibers longitudinally along the incision with full-thickness initially and then we took care to elevate the extensor aponeurosis from the underlying ECRB and EDC tendons.  We then identified the unhealthy appearing discolored tissue consistent with the ECRB rupture and chronic degenerative change and sharply excised this with a combination of 15 blade and rongeur.  We carefully examined  Incisional bed and noted that the joint itself was exposed as is typical in that the posterior lateral collateral ligament structures were intact and the elbow was stable  to range of motion as these were not and interfered with.  At this point curette and rondure were used to clear the anterior portion of the epicondyle which is the  insertion of the ECRB/EDC tendons and expose the bone to promote healing of tendon to bone.  We then placed a 1.6 mm Arthrex fiber tack suture and ran a Krakw type locking stitch through the extensor aponeurosis and the remaining tendon and tied this to the bone pulling on the alternate limits the post.  We buried the knot to avoid and not being prominent.  Were happy with our final repair and then closed the longitudinal split in the extensor aponeurosis with running Vicryl.  Local anesthetic in the form of 0.25% Marcaine plain was irrigated 20 mL's into the surgical region.  The incision was thoroughly irrigated and closed in a multilayer fashion with absorbable suture in the skin.  A sterile dressing was placed as well as a splint and sling.  The patient was awoken from general anesthesia and taken to the PACU in stable condition without complication.    Alfonse Alpers, PA-C, present and scrubbed throughout the case, critical for completion in a timely fashion, and for retraction, instrumentation, closure.

## 2023-05-03 ENCOUNTER — Encounter (HOSPITAL_BASED_OUTPATIENT_CLINIC_OR_DEPARTMENT_OTHER): Payer: Self-pay | Admitting: Orthopaedic Surgery

## 2023-05-16 ENCOUNTER — Ambulatory Visit
Admission: RE | Admit: 2023-05-16 | Discharge: 2023-05-16 | Disposition: A | Discharge status: Home / Self Care | Payer: Medicaid Other | Source: Ambulatory Visit | Attending: Family Medicine | Admitting: Family Medicine

## 2023-05-16 ENCOUNTER — Other Ambulatory Visit: Payer: Self-pay

## 2023-05-16 VITALS — BP 150/91 | HR 88 | Temp 98.4°F | Resp 18

## 2023-05-16 DIAGNOSIS — R200 Anesthesia of skin: Secondary | ICD-10-CM | POA: Diagnosis not present

## 2023-05-16 DIAGNOSIS — M5412 Radiculopathy, cervical region: Secondary | ICD-10-CM

## 2023-05-16 NOTE — ED Provider Notes (Signed)
 [facility]    CSN: [id number] Arrival date & time: 05/16/23  4098      History   Chief Complaint Chief Complaint  Patient presents with   Arm Injury   Arm Pain    HPI Wayne Roberts is a 53 y.o. male.   Patient here with right shoulder pain with a sharp pain radiating down into the right hand.  Patient reports loss of sensation involving the index and middle fingertips.  He endorses numbness involving the entire right hand.  Patient experienced symptoms immediately after he had left elbow surgery last week.  He saw his surgeon Dr. Ramond Marrow after his postop visit and reported that he was experiencing some pain in the shoulder and numbness in the right hand.  He was placed on prednisone and also had a neck plain film x-ray and reports that he was told that there were some mild inflammation present.  He reports the prednisone did nothing to reduce the numbness and pain.  He was also prescribed oxycodone for pain for the surgery he reports that it did nothing for the pain involving his right shoulder and arm. He endorses at night when attempting to rest the pain exacerbates and he has to dangle his arm either over the side of the bed or other position in which he can obtain comfort in order to sleep.  Past Medical History:  Diagnosis Date   Acid reflux    Arthritis    Chronic back pain    Herniated disc    Hypercholesteremia    Hypertension     Patient Active Problem List   Diagnosis Date Noted   GERD (gastroesophageal reflux disease) 01/10/2018   Palpitations 01/06/2013   Hyperlipidemia 01/06/2013   Abnormal liver function tests 01/06/2013   Low back pain 11/13/2011   Myofascial muscle pain 11/13/2011    Past Surgical History:  Procedure Laterality Date   FOOT SURGERY Right    LIGAMENT REPAIR Left 05/02/2023   Procedure: LIGAMENT REPAIR;  Surgeon: Bjorn Pippin, MD;  Location: [facility];  Service: Orthopedics;  Laterality: Left;   SHOULDER  ARTHROSCOPY WITH DISTAL CLAVICLE RESECTION Right 12/22/2020   Procedure: SHOULDER ARTHROSCOPY WITH DISTAL CLAVICLE RESECTION;  Surgeon: Bjorn Pippin, MD;  Location: [facility];  Service: Orthopedics;  Laterality: Right;   SHOULDER ARTHROSCOPY WITH SUBACROMIAL DECOMPRESSION, ROTATOR CUFF REPAIR AND BICEP TENDON REPAIR Right 12/22/2020   Procedure: SHOULDER ARTHROSCOPY WITH SUBACROMIAL DECOMPRESSION, ROTATOR CUFF REPAIR AND BICEP TENODESIS;  Surgeon: Bjorn Pippin, MD;  Location: [facility];  Service: Orthopedics;  Laterality: Right;   TENNIS ELBOW RELEASE/NIRSCHEL PROCEDURE Left 05/02/2023   Procedure: TENNIS ELBOW RELEASE/NIRSCHEL PROCEDURE;  Surgeon: Bjorn Pippin, MD;  Location:  SURGERY CENTER;  Service: Orthopedics;  Laterality: Left;       Home Medications    Prior to Admission medications   Medication Sig Start Date End Date Taking? Authorizing Provider  acetaminophen (TYLENOL) 500 MG tablet Take 2 tablets (1,000 mg total) by mouth every 8 (eight) hours for 14 days. 05/02/23 05/16/23 Yes McBane, Jerald Kief, PA-C  amLODipine (NORVASC) 5 MG tablet Take 5 mg by mouth daily.   Yes [provider]  celecoxib (CELEBREX) 100 MG capsule Take 1 capsule (100 mg total) by mouth 2 (two) times daily. For 2 weeks. Then take as needed 05/02/23 06/01/23 Yes McBane, Jerald Kief, PA-C  omeprazole (PRILOSEC) 20 MG capsule Take 20 mg by mouth daily.   Yes [provider]  rosuvastatin (CRESTOR) 10 MG tablet Take 10 mg by mouth daily.   Yes [provider]  valsartan (DIOVAN) 160 MG tablet Take 12.5 mg by mouth daily.   Yes [provider]    Family History Family History  Problem Relation Age of Onset   Cancer - Other Father        liver   Cancer - Other Maternal Grandfather    Cancer - Other Maternal Grandmother     Social History Social History   Tobacco Use   Smoking status: Every Day    Current packs/day: 0.25    Types:  Cigarettes   Smokeless tobacco: Never  Vaping Use   Vaping status: Never Used  Substance Use Topics   Alcohol use: Yes    Alcohol/week: 1.0 standard drink of alcohol    Types: 1 Standard drinks or equivalent per week    Comment: social   Drug use: No     Allergies   Diclofenac   Review of Systems Review of Systems  Musculoskeletal:        Right shoulder pain, right hand numbness, loss of sensation 2nd and 3rd digit  Neurological:  Positive for weakness (right upper extremity) and numbness (right upper extremity).     Physical Exam Triage Vital Signs ED Triage Vitals [05/16/23 0910]  Encounter Vitals Group     BP (!) 150/91     Systolic BP Percentile      Diastolic BP Percentile      Pulse Rate 88     Resp 18     Temp 98.4 F (36.9 C)     Temp Source Oral     SpO2 98 %     Weight      Height      Head Circumference      Peak Flow      Pain Score      Pain Loc      Pain Education      Exclude from Growth Chart    No data found.  Updated Vital Signs BP (!) 150/91 (BP Location: Right Arm)   Pulse 88   Temp 98.4 F (36.9 C) (Oral)   Resp 18   SpO2 98%   Visual Acuity Right Eye Distance:   Left Eye Distance:   Bilateral Distance:    Right Eye Near:   Left Eye Near:    Bilateral Near:     Physical Exam Vitals reviewed.  Constitutional:      Appearance: Normal appearance.  Cardiovascular:     Rate and Rhythm: Normal rate and regular rhythm.  Pulmonary:     Effort: Pulmonary effort is normal.     Breath sounds: Normal breath sounds.  Musculoskeletal:     Right shoulder: Tenderness present. No swelling or deformity. Decreased range of motion. Decreased strength.  Neurological:     Mental Status: He is alert.      UC Treatments / Results  Labs (all labs ordered are listed, but only abnormal results are displayed) Labs Reviewed - No data to display  EKG   Radiology No results found.  Procedures Procedures (including critical care  time)  Medications Ordered in UC Medications - No data to display  Initial Impression / Assessment and Plan / UC Course  I have reviewed the triage vital signs and the nursing notes.  Pertinent labs & imaging results that were available during my care of the patient were reviewed by me and considered in my  medical decision making (see chart for details).    This with patient he requires more extensive workup and likely advanced imaging to determine the cause of loss of sensation involving his right hand and the radicular pain involving his shoulder.  I suspect it is related to his previous history of rotator cuff surgery and he likely will require advanced imaging to determine the source of his symptoms as it seems to be neurological in nature.  Patient instructed to call Dr. Everardo Pacific his surgeon at [facility] office for further workup and evaluation today without delay given that he is experiencing some weakness and loss of sensation in the right upper extremity.  Patient verbalized understanding and agreement with plan  Final Clinical Impressions(s) / UC Diagnoses   Final diagnoses:  Radicular pain of shoulder  Loss of feeling or sensation, right hand, right index and middle finger      Discharge Instructions      Follow-up with Dr. Everardo Pacific office at [facility]     ED Prescriptions   None    PDMP not reviewed this encounter.   Bing Neighbors, NP 05/16/23 [phone]

## 2023-05-16 NOTE — Discharge Instructions (Addendum)
 Follow-up with Dr. Everardo Pacific office at [facility]

## 2023-05-16 NOTE — ED Triage Notes (Signed)
 Just had tennis elbow surgery last week on left arm BUT right arm feels like tendon is torn again(rotator cuff surgery last year) and 2 fingers are numb at the tip - Entered by patient  Pt reports he had surgery on left elbow on 05/01/2022. States he just completed a course of steroids for left arm pain that started after the surgery. States he thinks it may be due to positioning during the surgery. He also had an xray done of his neck when the steroids were prescribed at his post-op visit. States his index and middle fingertips are numb in his right hand. He was taking oxycodone but it wasn't helping so he stopped taking it.

## 2023-06-06 ENCOUNTER — Ambulatory Visit
